# Patient Record
Sex: Female | Born: 1950 | Race: Black or African American | Hispanic: No | Marital: Single | Smoking: Former smoker
Health system: Southern US, Community
[De-identification: ages and names within clinical notes are randomized; demographics above are authoritative.]

## PROBLEM LIST (undated history)

## (undated) DIAGNOSIS — I10 Essential (primary) hypertension: Secondary | ICD-10-CM

## (undated) DIAGNOSIS — R51 Headache: Secondary | ICD-10-CM

## (undated) DIAGNOSIS — R011 Cardiac murmur, unspecified: Secondary | ICD-10-CM

## (undated) DIAGNOSIS — M1990 Unspecified osteoarthritis, unspecified site: Secondary | ICD-10-CM

## (undated) DIAGNOSIS — Z889 Allergy status to unspecified drugs, medicaments and biological substances status: Secondary | ICD-10-CM

## (undated) DIAGNOSIS — R519 Headache, unspecified: Secondary | ICD-10-CM

## (undated) DIAGNOSIS — R609 Edema, unspecified: Secondary | ICD-10-CM

## (undated) HISTORY — PX: DIAGNOSTIC LAPAROSCOPY: SUR761

## (undated) HISTORY — PX: TONSILLECTOMY: SUR1361

## (undated) HISTORY — PX: OTHER SURGICAL HISTORY: SHX169

---

## 1985-07-18 HISTORY — PX: ABDOMINAL HYSTERECTOMY: SHX81

## 1985-07-18 HISTORY — PX: APPENDECTOMY: SHX54

## 1991-07-19 HISTORY — PX: KNEE ARTHROSCOPY: SUR90

## 1993-07-18 HISTORY — PX: CHOLECYSTECTOMY: SHX55

## 2004-10-25 ENCOUNTER — Ambulatory Visit: Payer: Self-pay | Admitting: Family Medicine

## 2006-01-06 ENCOUNTER — Ambulatory Visit: Payer: Self-pay | Admitting: Family Medicine

## 2006-10-18 ENCOUNTER — Ambulatory Visit: Payer: Self-pay | Admitting: Unknown Physician Specialty

## 2007-01-18 ENCOUNTER — Ambulatory Visit: Payer: Self-pay | Admitting: Family Medicine

## 2008-02-05 ENCOUNTER — Ambulatory Visit: Payer: Self-pay | Admitting: Family Medicine

## 2009-04-07 ENCOUNTER — Ambulatory Visit: Payer: Self-pay | Admitting: Family Medicine

## 2010-05-12 ENCOUNTER — Ambulatory Visit: Payer: Self-pay

## 2011-12-27 ENCOUNTER — Ambulatory Visit: Payer: Self-pay

## 2013-01-16 ENCOUNTER — Ambulatory Visit: Payer: Self-pay

## 2014-01-29 ENCOUNTER — Ambulatory Visit: Payer: Self-pay

## 2014-05-16 ENCOUNTER — Encounter (HOSPITAL_COMMUNITY): Payer: Self-pay | Admitting: Pharmacy Technician

## 2014-05-19 ENCOUNTER — Inpatient Hospital Stay (HOSPITAL_COMMUNITY)
Admission: RE | Admit: 2014-05-19 | Discharge: 2014-05-19 | Disposition: A | Discharge status: Home / Self Care | Payer: Self-pay | Source: Ambulatory Visit

## 2014-05-19 NOTE — Pre-Procedure Instructions (Signed)
Sophia CurryRena T Lowery  05/19/2014   Your procedure is scheduled on:  Friday, Nov. 13th   [facility] at 5:30 AM.  Call this number if you have problems the morning of surgery: [phone]   Remember:   Do not eat food or drink liquids after midnight Thursday.   Take these medicines the morning of surgery with A SIP OF WATER: Zyrtec   Do not wear jewelry, make-up or nail polish.  Do not wear lotions, powders, or perfumes. You may NOT wear deodorant the day of surgery.  Do not shave underarms & legs 48 hours prior to surgery.  Do not bring valuables to the hospital.  [facility] is not responsible for any belongings or valuables.               Contacts, dentures or bridgework may not be worn into surgery.  Leave suitcase in the car. After surgery it may be brought to your room.  For patients admitted to the hospital, discharge time is determined by your treatment team.    Name and phone number of your driver:    Special Instructions: "Preparing for Surgery" instruction sheet.   Please read over the following fact sheets that you were given: Pain Booklet, Coughing and Deep Breathing, Blood Transfusion Information, MRSA Information and Surgical Site Infection Prevention

## 2014-05-20 NOTE — H&P (Signed)
  Sophia CurryRena T Stclair is an 63 y.o. female.    Chief Complaint: right knee pain   HPI: Pt is a 63 y.o. female complaining of right knee pain for multiple years. Pain had continually increased since the beginning. X-rays in the clinic show end-stage arthritic changes of the right knee. Pt has tried various conservative treatments which have failed to alleviate their symptoms, including injections and therapy. Various options are discussed with the patient. Risks, benefits and expectations were discussed with the patient. Patient understand the risks, benefits and expectations and wishes to proceed with surgery.   PCP:  Aniceto BossSETTLE,PAUL C, MD  D/C Plans:  Home with HHPT  PMH: No past medical history on file.  PSH: No past surgical history on file.  Social History:  has no tobacco, alcohol, and drug history on file.  Allergies:  Allergies  Allergen Reactions  . Other     Certain antibiotics cause yeast infections    Medications: No current facility-administered medications for this encounter.   Current Outpatient Prescriptions  Medication Sig Dispense Refill  . cetirizine (ZYRTEC) 10 MG tablet Take 10 mg by mouth daily.    . furosemide (LASIX) 20 MG tablet Take 20 mg by mouth daily.    Marland Kitchen. ibuprofen (ADVIL,MOTRIN) 200 MG tablet Take 800 mg by mouth every 6 (six) hours as needed for mild pain or moderate pain.    . Multiple Vitamins-Minerals (MULTIVITAMIN ADULTS 50+ PO) Take 1 tablet by mouth daily.    . potassium chloride SA (K-DUR,KLOR-CON) 20 MEQ tablet Take 20 mEq by mouth daily.      No results found for this or any previous visit (from the past 48 hour(s)). No results found.  ROS: Pain with rom of the right lower extremity  Physical Exam:  Alert and oriented 63 y.o. female in no acute distress Cranial nerves 2-12 intact Cervical spine: full rom with no tenderness, nv intact distally Chest: active breath sounds bilaterally, no wheeze rhonchi or rales Heart: regular rate and  rhythm, no murmur Abd: non tender non distended with active bowel sounds Hip is stable with rom  Right knee with moderate crepitus and moderate medial joint line tenderness nv intact distally Slight antalgic gait No rashes   Assessment/Plan Assessment: right knee end stage osteoarthritis  Plan: Patient will undergo a right total knee arthroplasty by [facility]Nor[facility]. Risks benefits and expectations were discussed with the patient. Patient understand risks, benefits and expectations and wishes to proceed.

## 2014-05-26 ENCOUNTER — Encounter (HOSPITAL_COMMUNITY): Payer: Self-pay

## 2014-05-26 ENCOUNTER — Encounter (HOSPITAL_COMMUNITY)
Admission: RE | Admit: 2014-05-26 | Discharge: 2014-05-26 | Disposition: A | Discharge status: Home / Self Care | Payer: No Typology Code available for payment source | Source: Ambulatory Visit | Attending: Orthopedic Surgery | Admitting: Orthopedic Surgery

## 2014-05-26 DIAGNOSIS — M179 Osteoarthritis of knee, unspecified: Secondary | ICD-10-CM | POA: Diagnosis not present

## 2014-05-26 DIAGNOSIS — Z01812 Encounter for preprocedural laboratory examination: Secondary | ICD-10-CM | POA: Diagnosis not present

## 2014-05-26 HISTORY — DX: Headache, unspecified: R51.9

## 2014-05-26 HISTORY — DX: Essential (primary) hypertension: I10

## 2014-05-26 HISTORY — DX: Cardiac murmur, unspecified: R01.1

## 2014-05-26 HISTORY — DX: Headache: R51

## 2014-05-26 HISTORY — DX: Allergy status to unspecified drugs, medicaments and biological substances: Z88.9

## 2014-05-26 HISTORY — DX: Unspecified osteoarthritis, unspecified site: M19.90

## 2014-05-26 HISTORY — DX: Edema, unspecified: R60.9

## 2014-05-26 LAB — TYPE AND SCREEN
ABO/RH(D): O POS
Antibody Screen: NEGATIVE

## 2014-05-26 LAB — BASIC METABOLIC PANEL
ANION GAP: 13 (ref 5–15)
BUN: 18 mg/dL (ref 6–23)
CALCIUM: 9.2 mg/dL (ref 8.4–10.5)
CHLORIDE: 101 meq/L (ref 96–112)
CO2: 27 meq/L (ref 19–32)
Creatinine, Ser: 0.77 mg/dL (ref 0.50–1.10)
GFR calc Af Amer: >90 mL/min (ref >90)
GFR calc non Af Amer: 89 mL/min — ABNORMAL LOW (ref >90)
GLUCOSE: 89 mg/dL (ref 70–99)
Potassium: 4.1 mEq/L (ref 3.7–5.3)
Sodium: 141 mEq/L (ref 137–147)

## 2014-05-26 LAB — CBC
HEMATOCRIT: 39.9 % (ref 36.0–46.0)
Hemoglobin: 12.8 g/dL (ref 12.0–15.0)
MCH: 27.8 pg (ref 26.0–34.0)
MCHC: 32.1 g/dL (ref 30.0–36.0)
MCV: 86.6 fL (ref 78.0–100.0)
PLATELETS: 339 10*3/uL (ref 150–400)
RBC: 4.61 MIL/uL (ref 3.87–5.11)
RDW: 13.8 % (ref 11.5–15.5)
WBC: 9.2 10*3/uL (ref 4.0–10.5)

## 2014-05-26 LAB — SURGICAL PCR SCREEN
MRSA, PCR: NEGATIVE
Staphylococcus aureus: NEGATIVE

## 2014-05-26 MED ORDER — CHLORHEXIDINE GLUCONATE 4 % EX LIQD: 60.0000 mL | Freq: Once | CUTANEOUS | Status: DC

## 2014-05-26 NOTE — Pre-Procedure Instructions (Signed)
Sophia CurryRena T Lowery  05/26/2014   Your procedure is scheduled on:  Friday, Nov. 13th   [facility] at 5:30 AM.  Call this number if you have problems the morning of surgery: [phone]   Remember:   Do not eat food or drink liquids after midnight Thursday.   Take these medicines the morning of surgery with A SIP OF WATER: Zyrtec   Do not wear jewelry, make-up or nail polish.  Do not wear lotions, powders, or perfumes. You may NOT wear deodorant the day of surgery.  Do not shave underarms & legs 48 hours prior to surgery.  Do not bring valuables to the hospital.  [facility] is not responsible for any belongings or valuables.               Contacts, dentures or bridgework may not be worn into surgery.  Leave suitcase in the car. After surgery it may be brought to your room.  For patients admitted to the hospital, discharge time is determined by your treatment team.       Special Instructions: [facility] - Preparing for Surgery  Before surgery, you can play an important role.  Because skin is not sterile, your skin needs to be as free of germs as possible.  You can reduce the number of germs on you skin by washing with CHG (chlorahexidine gluconate) soap before surgery.  CHG is an antiseptic cleaner which kills germs and bonds with the skin to continue killing germs even after washing.  Please DO NOT use if you have an allergy to CHG or antibacterial soaps.  If your skin becomes reddened/irritated stop using the CHG and inform your nurse when you arrive at Short Stay.  Do not shave (including legs and underarms) for at least 48 hours prior to the first CHG shower.  You may shave your face.  Please follow these instructions carefully:   1.  Shower with CHG Soap the night before surgery and the                                morning of Surgery.  2.  If you choose to wash your hair, wash your hair first as usual with your       normal shampoo.  3.  After you  shampoo, rinse your hair and body thoroughly to remove the                      Shampoo.  4.  Use CHG as you would any other liquid soap.  You can apply chg directly       to the skin and wash gently with scrungie or a clean washcloth.  5.  Apply the CHG Soap to your body ONLY FROM THE NECK DOWN.        Do not use on open wounds or open sores.  Avoid contact with your eyes,       ears, mouth and genitals (private parts).  Wash genitals (private parts)       with your normal soap.  6.  Wash thoroughly, paying special attention to the area where your surgery        will be performed.  7.  Thoroughly rinse your body with warm water from the neck down.  8.  DO NOT shower/wash with your normal soap after using and rinsing off  the CHG Soap.  9.  Pat yourself dry with a clean towel.            10.  Wear clean pajamas.            11.  Place clean sheets on your bed the night of your first shower and do not        sleep with pets.  Day of Surgery  Do not apply any lotions/deoderants the morning of surgery.  Please wear clean clothes to the hospital/surgery center.      Please read over the following fact sheets that you were given: Pain Booklet, Coughing and Deep Breathing, Blood Transfusion Information, MRSA Information and Surgical Site Infection Prevention

## 2014-05-27 LAB — ABO/RH: ABO/RH(D): O POS

## 2014-05-27 NOTE — Progress Notes (Signed)
Anesthesia Chart Review:  Patient is a 63 year old female scheduled for right TKA on 05/30/14 by Dr. Ranell PatrickNorris. Posted for [facility].  History includes former smoker, HTN, murmur (childhood), arthritis, edema (on Lasix), tonsillectomy, hysterectomy, cholecystectomy. BMI is consistent with morbid obesity. PCP is Dr. Salvatore DecentPaul Settle in [place] who "approved" her for knee replacement following a 04/08/14 office visit.  EKG on 04/08/14 (PCP) showed: NSR.  CXR was requested from Dr. Eudelia BunchSettle's office, but is still pending.  Preoperative labs noted.  PT/PTT not done at PAT, so ordered for the day of surgery since this is a TKA.  Sophia Ochsllison Crystalle Popwell, PA-C [facility] Short Stay Center/Anesthesiology Phone [phone] 05/27/2014 11:16 AM

## 2014-05-29 MED ORDER — CEFAZOLIN SODIUM-DEXTROSE 2-3 GM-% IV SOLR
2.0000 g | INTRAVENOUS | Status: AC
  Administered 2014-05-30: 2 g via INTRAVENOUS
  Filled 2014-05-29: qty 50

## 2014-05-30 ENCOUNTER — Ambulatory Visit (HOSPITAL_COMMUNITY): Payer: No Typology Code available for payment source | Admitting: Vascular Surgery

## 2014-05-30 ENCOUNTER — Ambulatory Visit (HOSPITAL_COMMUNITY): Payer: No Typology Code available for payment source | Admitting: Anesthesiology

## 2014-05-30 ENCOUNTER — Encounter (HOSPITAL_COMMUNITY): Payer: Self-pay | Admitting: *Deleted

## 2014-05-30 ENCOUNTER — Inpatient Hospital Stay (HOSPITAL_COMMUNITY): Payer: No Typology Code available for payment source

## 2014-05-30 ENCOUNTER — Encounter (HOSPITAL_COMMUNITY)
Admission: RE | Disposition: A | Discharge status: Skilled Nursing Facility | Payer: Self-pay | Source: Ambulatory Visit | Attending: Orthopedic Surgery

## 2014-05-30 ENCOUNTER — Inpatient Hospital Stay (HOSPITAL_COMMUNITY)
Admission: RE | Admit: 2014-05-30 | Discharge: 2014-06-03 | DRG: 470 | Disposition: A | Discharge status: Skilled Nursing Facility | Payer: No Typology Code available for payment source | Source: Ambulatory Visit | Attending: Orthopedic Surgery | Admitting: Orthopedic Surgery

## 2014-05-30 DIAGNOSIS — I10 Essential (primary) hypertension: Secondary | ICD-10-CM | POA: Diagnosis present

## 2014-05-30 DIAGNOSIS — M1711 Unilateral primary osteoarthritis, right knee: Secondary | ICD-10-CM | POA: Diagnosis present

## 2014-05-30 DIAGNOSIS — Z6841 Body Mass Index (BMI) 40.0 and over, adult: Secondary | ICD-10-CM | POA: Diagnosis not present

## 2014-05-30 DIAGNOSIS — M179 Osteoarthritis of knee, unspecified: Principal | ICD-10-CM | POA: Diagnosis present

## 2014-05-30 DIAGNOSIS — M25561 Pain in right knee: Secondary | ICD-10-CM | POA: Diagnosis present

## 2014-05-30 HISTORY — PX: TOTAL KNEE ARTHROPLASTY: SHX125

## 2014-05-30 LAB — CREATININE, SERUM
Creatinine, Ser: 0.66 mg/dL (ref 0.50–1.10)
GFR calc Af Amer: >90 mL/min (ref >90)
GFR calc non Af Amer: >90 mL/min (ref >90)

## 2014-05-30 LAB — PROTIME-INR
INR: 1.02 (ref 0.00–1.49)
Prothrombin Time: 13.5 seconds (ref 11.6–15.2)

## 2014-05-30 LAB — CBC
HEMATOCRIT: 39.5 % (ref 36.0–46.0)
Hemoglobin: 12.5 g/dL (ref 12.0–15.0)
MCH: 27 pg (ref 26.0–34.0)
MCHC: 31.6 g/dL (ref 30.0–36.0)
MCV: 85.3 fL (ref 78.0–100.0)
Platelets: 251 10*3/uL (ref 150–400)
RBC: 4.63 MIL/uL (ref 3.87–5.11)
RDW: 13.9 % (ref 11.5–15.5)
WBC: 14.2 10*3/uL — ABNORMAL HIGH (ref 4.0–10.5)

## 2014-05-30 LAB — APTT: aPTT: 31 seconds (ref 24–37)

## 2014-05-30 SURGERY — ARTHROPLASTY, KNEE, TOTAL
Anesthesia: Regional | Site: Knee | Laterality: Right

## 2014-05-30 MED ORDER — PROMETHAZINE HCL 25 MG/ML IJ SOLN: 6.2500 mg | INTRAMUSCULAR | Status: DC | PRN

## 2014-05-30 MED ORDER — LORATADINE 10 MG PO TABS
10.0000 mg | ORAL_TABLET | Freq: Every day | ORAL | Status: DC
Start: 2014-05-31 — End: 2014-06-03
  Administered 2014-05-31 – 2014-06-03 (×4): 10 mg via ORAL
  Filled 2014-05-30 (×4): qty 1

## 2014-05-30 MED ORDER — PHENOL 1.4 % MT LIQD: 1.0000 | OROMUCOSAL | Status: DC | PRN

## 2014-05-30 MED ORDER — FENTANYL CITRATE 0.05 MG/ML IJ SOLN
INTRAMUSCULAR | Status: AC
  Filled 2014-05-30: qty 5

## 2014-05-30 MED ORDER — WARFARIN VIDEO
Freq: Once | Status: AC
  Administered 2014-05-31: 18:00:00

## 2014-05-30 MED ORDER — ROCURONIUM BROMIDE 50 MG/5ML IV SOLN
INTRAVENOUS | Status: AC
  Filled 2014-05-30: qty 1

## 2014-05-30 MED ORDER — LACTATED RINGERS IV SOLN
INTRAVENOUS | Status: DC | PRN
  Administered 2014-05-30 (×2): via INTRAVENOUS

## 2014-05-30 MED ORDER — NEOSTIGMINE METHYLSULFATE 10 MG/10ML IV SOLN
INTRAVENOUS | Status: AC
  Filled 2014-05-30: qty 1

## 2014-05-30 MED ORDER — OXYCODONE-ACETAMINOPHEN 5-325 MG PO TABS: 1.0000 | ORAL_TABLET | ORAL | Status: DC | PRN

## 2014-05-30 MED ORDER — OXYCODONE HCL 5 MG/5ML PO SOLN: 5.0000 mg | Freq: Once | ORAL | Status: DC | PRN

## 2014-05-30 MED ORDER — GLYCOPYRROLATE 0.2 MG/ML IJ SOLN
INTRAMUSCULAR | Status: AC
  Filled 2014-05-30: qty 2

## 2014-05-30 MED ORDER — METOCLOPRAMIDE HCL 5 MG/ML IJ SOLN: 5.0000 mg | Freq: Three times a day (TID) | INTRAMUSCULAR | Status: DC | PRN

## 2014-05-30 MED ORDER — EPHEDRINE SULFATE 50 MG/ML IJ SOLN
INTRAMUSCULAR | Status: AC
  Filled 2014-05-30: qty 1

## 2014-05-30 MED ORDER — WARFARIN SODIUM 5 MG PO TABS: 5.0000 mg | ORAL_TABLET | Freq: Every day | ORAL | Status: DC

## 2014-05-30 MED ORDER — FENTANYL CITRATE 0.05 MG/ML IJ SOLN
INTRAMUSCULAR | Status: DC | PRN
  Administered 2014-05-30 (×5): 50 ug via INTRAVENOUS

## 2014-05-30 MED ORDER — METHOCARBAMOL 500 MG PO TABS
500.0000 mg | ORAL_TABLET | Freq: Four times a day (QID) | ORAL | Status: DC | PRN
Start: 2014-05-30 — End: 2014-06-03
  Administered 2014-05-30 – 2014-06-03 (×10): 500 mg via ORAL
  Filled 2014-05-30 (×11): qty 1

## 2014-05-30 MED ORDER — WARFARIN SODIUM 10 MG PO TABS
10.0000 mg | ORAL_TABLET | Freq: Once | ORAL | Status: AC
  Administered 2014-05-30: 10 mg via ORAL
  Filled 2014-05-30: qty 1

## 2014-05-30 MED ORDER — SODIUM CHLORIDE 0.9 % IV SOLN
INTRAVENOUS | Status: DC
  Administered 2014-05-30: 14:00:00 via INTRAVENOUS

## 2014-05-30 MED ORDER — NEOSTIGMINE METHYLSULFATE 10 MG/10ML IV SOLN
INTRAVENOUS | Status: DC | PRN
Start: 2014-05-30 — End: 2014-05-30
  Administered 2014-05-30: 3 mg via INTRAVENOUS

## 2014-05-30 MED ORDER — ROCURONIUM BROMIDE 100 MG/10ML IV SOLN
INTRAVENOUS | Status: DC | PRN
  Administered 2014-05-30: 50 mg via INTRAVENOUS

## 2014-05-30 MED ORDER — ACETAMINOPHEN 325 MG PO TABS
650.0000 mg | ORAL_TABLET | Freq: Four times a day (QID) | ORAL | Status: DC | PRN
  Administered 2014-06-02 (×3): 650 mg via ORAL
  Filled 2014-05-30 (×3): qty 2

## 2014-05-30 MED ORDER — SODIUM CHLORIDE 0.9 % IJ SOLN
INTRAMUSCULAR | Status: AC
  Filled 2014-05-30: qty 10

## 2014-05-30 MED ORDER — MULTIVITAMIN ADULTS 50+ PO TABS: 1.0000 | ORAL_TABLET | Freq: Every day | ORAL | Status: DC

## 2014-05-30 MED ORDER — ONDANSETRON HCL 4 MG/2ML IJ SOLN
INTRAMUSCULAR | Status: AC
  Filled 2014-05-30: qty 2

## 2014-05-30 MED ORDER — MIDAZOLAM HCL 2 MG/2ML IJ SOLN
INTRAMUSCULAR | Status: AC
  Filled 2014-05-30: qty 2

## 2014-05-30 MED ORDER — ADULT MULTIVITAMIN W/MINERALS CH
1.0000 | ORAL_TABLET | Freq: Every day | ORAL | Status: DC
  Administered 2014-05-31 – 2014-06-03 (×3): 1 via ORAL
  Filled 2014-05-30 (×5): qty 1

## 2014-05-30 MED ORDER — CELECOXIB 200 MG PO CAPS
200.0000 mg | ORAL_CAPSULE | Freq: Two times a day (BID) | ORAL | Status: DC
  Administered 2014-05-30 – 2014-06-03 (×8): 200 mg via ORAL
  Filled 2014-05-30 (×10): qty 1

## 2014-05-30 MED ORDER — SODIUM CHLORIDE 0.9 % IR SOLN
Status: DC | PRN
  Administered 2014-05-30 (×2): 1000 mL

## 2014-05-30 MED ORDER — HYDROMORPHONE HCL 1 MG/ML IJ SOLN
INTRAMUSCULAR | Status: AC
  Filled 2014-05-30: qty 1

## 2014-05-30 MED ORDER — ACETAMINOPHEN 650 MG RE SUPP: 650.0000 mg | Freq: Four times a day (QID) | RECTAL | Status: DC | PRN

## 2014-05-30 MED ORDER — BISACODYL 10 MG RE SUPP
10.0000 mg | Freq: Every day | RECTAL | Status: DC | PRN
  Administered 2014-06-03: 10 mg via RECTAL
  Filled 2014-05-30: qty 1

## 2014-05-30 MED ORDER — METHOCARBAMOL 1000 MG/10ML IJ SOLN
500.0000 mg | Freq: Four times a day (QID) | INTRAVENOUS | Status: DC | PRN
  Filled 2014-05-30: qty 5

## 2014-05-30 MED ORDER — GLYCOPYRROLATE 0.2 MG/ML IJ SOLN
INTRAMUSCULAR | Status: DC | PRN
  Administered 2014-05-30: 0.4 mg via INTRAVENOUS

## 2014-05-30 MED ORDER — OXYCODONE HCL 5 MG PO TABS: 5.0000 mg | ORAL_TABLET | Freq: Once | ORAL | Status: DC | PRN

## 2014-05-30 MED ORDER — METHOCARBAMOL 1000 MG/10ML IJ SOLN
500.0000 mg | INTRAVENOUS | Status: AC
  Administered 2014-05-30: 500 mg via INTRAVENOUS
  Filled 2014-05-30: qty 5

## 2014-05-30 MED ORDER — HYDROMORPHONE HCL 1 MG/ML IJ SOLN
0.2500 mg | INTRAMUSCULAR | Status: DC | PRN
  Administered 2014-05-30 (×4): 0.5 mg via INTRAVENOUS

## 2014-05-30 MED ORDER — ENOXAPARIN SODIUM 30 MG/0.3ML ~~LOC~~ SOLN
30.0000 mg | Freq: Two times a day (BID) | SUBCUTANEOUS | Status: DC
  Administered 2014-05-31 – 2014-06-03 (×7): 30 mg via SUBCUTANEOUS
  Filled 2014-05-30 (×9): qty 0.3

## 2014-05-30 MED ORDER — METOCLOPRAMIDE HCL 10 MG PO TABS: 5.0000 mg | ORAL_TABLET | Freq: Three times a day (TID) | ORAL | Status: DC | PRN

## 2014-05-30 MED ORDER — CEFAZOLIN SODIUM-DEXTROSE 2-3 GM-% IV SOLR
2.0000 g | Freq: Four times a day (QID) | INTRAVENOUS | Status: AC
  Administered 2014-05-30 – 2014-05-31 (×2): 2 g via INTRAVENOUS
  Filled 2014-05-30 (×2): qty 50

## 2014-05-30 MED ORDER — MIDAZOLAM HCL 5 MG/5ML IJ SOLN
INTRAMUSCULAR | Status: DC | PRN
  Administered 2014-05-30: 2 mg via INTRAVENOUS

## 2014-05-30 MED ORDER — POLYETHYLENE GLYCOL 3350 17 G PO PACK
17.0000 g | PACK | Freq: Every day | ORAL | Status: DC | PRN
  Administered 2014-06-01 – 2014-06-02 (×2): 17 g via ORAL
  Filled 2014-05-30 (×2): qty 1

## 2014-05-30 MED ORDER — METHOCARBAMOL 500 MG PO TABS: 500.0000 mg | ORAL_TABLET | Freq: Three times a day (TID) | ORAL | Status: AC | PRN

## 2014-05-30 MED ORDER — ONDANSETRON HCL 4 MG PO TABS: 4.0000 mg | ORAL_TABLET | Freq: Four times a day (QID) | ORAL | Status: DC | PRN

## 2014-05-30 MED ORDER — PATIENT'S GUIDE TO USING COUMADIN BOOK
Freq: Once | Status: AC
  Administered 2014-05-30: 1
  Filled 2014-05-30: qty 1

## 2014-05-30 MED ORDER — PROPOFOL 10 MG/ML IV BOLUS
INTRAVENOUS | Status: DC | PRN
  Administered 2014-05-30: 150 mg via INTRAVENOUS

## 2014-05-30 MED ORDER — ONDANSETRON HCL 4 MG/2ML IJ SOLN
INTRAMUSCULAR | Status: DC | PRN
  Administered 2014-05-30: 4 mg via INTRAVENOUS

## 2014-05-30 MED ORDER — WARFARIN - PHARMACIST DOSING INPATIENT
Freq: Every day | Status: DC
  Administered 2014-06-01: 18:00:00
  Filled 2014-05-30: qty 1

## 2014-05-30 MED ORDER — LIDOCAINE HCL (CARDIAC) 20 MG/ML IV SOLN
INTRAVENOUS | Status: DC | PRN
  Administered 2014-05-30: 80 mg via INTRAVENOUS

## 2014-05-30 MED ORDER — FUROSEMIDE 20 MG PO TABS
20.0000 mg | ORAL_TABLET | Freq: Every day | ORAL | Status: DC
  Administered 2014-05-31 – 2014-06-03 (×4): 20 mg via ORAL
  Filled 2014-05-30 (×5): qty 1

## 2014-05-30 MED ORDER — PROPOFOL 10 MG/ML IV BOLUS
INTRAVENOUS | Status: AC
  Filled 2014-05-30: qty 20

## 2014-05-30 MED ORDER — HYDROMORPHONE HCL 1 MG/ML IJ SOLN
1.0000 mg | INTRAMUSCULAR | Status: DC | PRN
  Administered 2014-05-30 – 2014-05-31 (×5): 1 mg via INTRAVENOUS
  Administered 2014-05-31: 2 mg via INTRAVENOUS
  Administered 2014-05-31: 1 mg via INTRAVENOUS
  Administered 2014-05-31: 2 mg via INTRAVENOUS
  Administered 2014-06-01: 1 mg via INTRAVENOUS
  Administered 2014-06-01: 2 mg via INTRAVENOUS
  Administered 2014-06-01: 1 mg via INTRAVENOUS
  Filled 2014-05-30: qty 2
  Filled 2014-05-30 (×4): qty 1
  Filled 2014-05-30: qty 2
  Filled 2014-05-30: qty 1
  Filled 2014-05-30: qty 2
  Filled 2014-05-30 (×2): qty 1
  Filled 2014-05-30: qty 2
  Filled 2014-05-30 (×3): qty 1

## 2014-05-30 MED ORDER — ONDANSETRON HCL 4 MG/2ML IJ SOLN: 4.0000 mg | Freq: Four times a day (QID) | INTRAMUSCULAR | Status: DC | PRN

## 2014-05-30 MED ORDER — 0.9 % SODIUM CHLORIDE (POUR BTL) OPTIME
TOPICAL | Status: DC | PRN
  Administered 2014-05-30: 1000 mL

## 2014-05-30 MED ORDER — POTASSIUM CHLORIDE CRYS ER 20 MEQ PO TBCR
20.0000 meq | EXTENDED_RELEASE_TABLET | Freq: Every day | ORAL | Status: DC
  Administered 2014-05-31 – 2014-06-03 (×4): 20 meq via ORAL
  Filled 2014-05-30 (×4): qty 1

## 2014-05-30 MED ORDER — OXYCODONE HCL 5 MG PO TABS
5.0000 mg | ORAL_TABLET | ORAL | Status: DC | PRN
  Administered 2014-05-30 – 2014-06-01 (×8): 10 mg via ORAL
  Administered 2014-06-01: 5 mg via ORAL
  Administered 2014-06-01 – 2014-06-03 (×10): 10 mg via ORAL
  Filled 2014-05-30 (×19): qty 2

## 2014-05-30 MED ORDER — BUPIVACAINE-EPINEPHRINE (PF) 0.5% -1:200000 IJ SOLN
INTRAMUSCULAR | Status: DC | PRN
  Administered 2014-05-30: 30 mL via PERINEURAL

## 2014-05-30 MED ORDER — MENTHOL 3 MG MT LOZG: 1.0000 | LOZENGE | OROMUCOSAL | Status: DC | PRN

## 2014-05-30 MED ORDER — LIDOCAINE HCL (CARDIAC) 20 MG/ML IV SOLN
INTRAVENOUS | Status: AC
  Filled 2014-05-30: qty 5

## 2014-05-30 SURGICAL SUPPLY — 60 items
BANDAGE ELASTIC 6 VELCRO ST LF (GAUZE/BANDAGES/DRESSINGS) ×2 IMPLANT
BANDAGE ESMARK 6X9 LF (GAUZE/BANDAGES/DRESSINGS) ×1 IMPLANT
BLADE SAG 18X100X1.27 (BLADE) ×2 IMPLANT
BLADE SAW SGTL 13.0X1.19X90.0M (BLADE) ×2 IMPLANT
BNDG ELASTIC 6X10 VLCR STRL LF (GAUZE/BANDAGES/DRESSINGS) ×2 IMPLANT
BNDG ESMARK 6X9 LF (GAUZE/BANDAGES/DRESSINGS) ×2
BNDG GAUZE ELAST 4 BULKY (GAUZE/BANDAGES/DRESSINGS) ×2 IMPLANT
BOWL SMART MIX CTS (DISPOSABLE) ×2 IMPLANT
CAP UPCHARGE REVISION TRAY ×2 IMPLANT
CAPT RP KNEE ×2 IMPLANT
CEMENT HV SMART SET (Cement) ×4 IMPLANT
COVER SURGICAL LIGHT HANDLE (MISCELLANEOUS) ×2 IMPLANT
CUFF TOURNIQUET SINGLE 34IN LL (TOURNIQUET CUFF) IMPLANT
CUFF TOURNIQUET SINGLE 44IN (TOURNIQUET CUFF) IMPLANT
DRAPE EXTREMITY T 121X128X90 (DRAPE) ×2 IMPLANT
DRAPE IMP U-DRAPE 54X76 (DRAPES) ×2 IMPLANT
DRAPE PROXIMA HALF (DRAPES) ×2 IMPLANT
DRAPE U-SHAPE 47X51 STRL (DRAPES) ×2 IMPLANT
DRSG ADAPTIC 3X8 NADH LF (GAUZE/BANDAGES/DRESSINGS) ×2 IMPLANT
DRSG PAD ABDOMINAL 8X10 ST (GAUZE/BANDAGES/DRESSINGS) ×2 IMPLANT
DURAPREP 26ML APPLICATOR (WOUND CARE) ×2 IMPLANT
ELECT CAUTERY BLADE 6.4 (BLADE) ×2 IMPLANT
ELECT REM PT RETURN 9FT ADLT (ELECTROSURGICAL) ×2
ELECTRODE REM PT RTRN 9FT ADLT (ELECTROSURGICAL) ×1 IMPLANT
GAUZE SPONGE 4X4 12PLY STRL (GAUZE/BANDAGES/DRESSINGS) ×2 IMPLANT
GLOVE BIOGEL PI ORTHO PRO 7.5 (GLOVE) ×1
GLOVE BIOGEL PI ORTHO PRO SZ8 (GLOVE) ×1
GLOVE ORTHO TXT STRL SZ7.5 (GLOVE) ×2 IMPLANT
GLOVE PI ORTHO PRO STRL 7.5 (GLOVE) ×1 IMPLANT
GLOVE PI ORTHO PRO STRL SZ8 (GLOVE) ×1 IMPLANT
GLOVE SURG ORTHO 8.5 STRL (GLOVE) ×2 IMPLANT
GOWN STRL REUS W/ TWL XL LVL3 (GOWN DISPOSABLE) ×3 IMPLANT
GOWN STRL REUS W/TWL XL LVL3 (GOWN DISPOSABLE) ×3
HANDPIECE INTERPULSE COAX TIP (DISPOSABLE) ×1
IMMOBILIZER KNEE 22 (SOFTGOODS) ×2 IMPLANT
IMMOBILIZER KNEE 22 UNIV (SOFTGOODS) IMPLANT
KIT BASIN OR (CUSTOM PROCEDURE TRAY) ×2 IMPLANT
KIT MANIFOLD (MISCELLANEOUS) ×2 IMPLANT
KIT ROOM TURNOVER OR (KITS) ×2 IMPLANT
MANIFOLD NEPTUNE II (INSTRUMENTS) ×2 IMPLANT
NS IRRIG 1000ML POUR BTL (IV SOLUTION) ×2 IMPLANT
PACK TOTAL JOINT (CUSTOM PROCEDURE TRAY) ×2 IMPLANT
PACK UNIVERSAL I (CUSTOM PROCEDURE TRAY) ×2 IMPLANT
PAD ARMBOARD 7.5X6 YLW CONV (MISCELLANEOUS) ×4 IMPLANT
SET HNDPC FAN SPRY TIP SCT (DISPOSABLE) ×1 IMPLANT
SPONGE GAUZE 4X4 12PLY STER LF (GAUZE/BANDAGES/DRESSINGS) ×2 IMPLANT
STAPLER VISISTAT 35W (STAPLE) ×2 IMPLANT
STRIP CLOSURE SKIN 1/2X4 (GAUZE/BANDAGES/DRESSINGS) ×2 IMPLANT
SUCTION FRAZIER TIP 10 FR DISP (SUCTIONS) ×2 IMPLANT
SUT MNCRL AB 3-0 PS2 18 (SUTURE) ×2 IMPLANT
SUT VIC AB 0 CT1 27 (SUTURE) ×2
SUT VIC AB 0 CT1 27XBRD ANBCTR (SUTURE) ×2 IMPLANT
SUT VIC AB 1 CT1 27 (SUTURE) ×3
SUT VIC AB 1 CT1 27XBRD ANBCTR (SUTURE) ×3 IMPLANT
SUT VIC AB 2-0 CT1 27 (SUTURE) ×2
SUT VIC AB 2-0 CT1 TAPERPNT 27 (SUTURE) ×2 IMPLANT
TOWEL OR 17X24 6PK STRL BLUE (TOWEL DISPOSABLE) ×2 IMPLANT
TOWEL OR 17X26 10 PK STRL BLUE (TOWEL DISPOSABLE) ×2 IMPLANT
TRAY FOLEY CATH 16FRSI W/METER (SET/KITS/TRAYS/PACK) IMPLANT
WATER STERILE IRR 1000ML POUR (IV SOLUTION) ×4 IMPLANT

## 2014-05-30 NOTE — Op Note (Signed)
Sophia Lowery:  Sophia Lowery                  ACCOUNT NO.:  [id number]  MEDICAL RECORD NO.:  [id number]  LOCATION:  [facility]                         FACILITY:  [facility]  PHYSICIAN:  Almedia BallsSteven R. Ranell PatrickNorris, M.D. DATE OF BIRTH:  Feb 12, 1951  DATE OF PROCEDURE:  05/30/2014 DATE OF DISCHARGE:                              OPERATIVE REPORT   PREOPERATIVE DIAGNOSIS:  Right knee end-stage osteoarthritis.  POSTOPERATIVE DIAGNOSIS:  Right knee end-stage osteoarthritis.  PROCEDURE PERFORMED:  Right total knee arthroplasty diffuse SIGMA rotating platform prosthesis.  ATTENDING SURGEON:  Malon KindleSteven Jens Siems, M.D.  ASSISTANT:  Konrad Felixhomas Brad San LuisDixon, [facility] who was scrubbed the entire procedure and necessary for satisfactory completion of surgery.  ANESTHESIA:  General anesthesia was used plus femoral block.  ESTIMATED BLOOD LOSS:  Minimal.  FLUID REPLACEMENT:  1500 mL crystalloid.  INSTRUMENT COUNT:  Correct.  COMPLICATIONS:  There were no complications.  ANTIBIOTICS:  Perioperative antibiotics were given.  INDICATIONS:  The patient is a 63 year old female presents with a history of worsening right knee pain.  The patient is now disabled by knee pain, unable to walk more than a block having to sit down and rest. The patient does used to assistance of cane to ambulate.  The patient's gait is unsteady.  The patient has bone-on-bone arthritis on x-ray. This failed conservative management consisting modification of activities, relative rest injections and presents for total knee arthroplasty to restore function, eliminate pain in her right knee. Informed consent obtained.  DESCRIPTION OF PROCEDURE:  After an adequate level of anesthesia achieved, the patient was positioned in the supine position.  The right leg correctly identified.  Nonsterile tourniquet placed on proximal thigh.  Right leg sterilely prepped and draped in usual manner.  Time- out was called.  We then after sterile prep and drape, we elevated the limb and  exsanguinated using Esmarch bandage.  We elevated the tourniquet to 350 mmHg.  Longitudinal midline incision was created the knee in flexion.  A medial parapatellar arthrotomy was created with a fresh 10 blade scalpel.  The patella was everted.  The lateral patellofemoral ligament divided in standard distal femur entered with a step-cut drill.  Inter medullary guide inserted.  We took 10 mm off the right distal femur set on 5 degrees for the right side.  We then went ahead and cut our tibia due to the extreme tightness in the knee.  We made a perpendicular cut with 2 degrees posterior slope for the MBT revision tray.  This was with an external alignment, jig and oscillating saw.  We then went ahead and removed ACL, PCL, and meniscal tissues and then sized the femur to a size 2.5 anterior down.  We placed our formalin block cutting anterior posterior and chamfer cuts.  We then used the lamina spreader removed posterior osteophytes off the posterior femoral condyles.  Next, also release remaining capsule.  We removed osteophytes off the proximal tibia.  We checked our flexion-extension gaps, which were symmetric at 10 mm.  We then went ahead and prepared the remaining preparation with a drill and punch on the proximal tibia. We placed her in the tree revision tray trial 2.5.  Next, we used our  box cutter guide to our box cut on the femur with an oscillating saw and then placed our 2.5 right femur in place, reduced it with a +10 per up with a 10 mm polyethylene insert 2.5 and then went ahead and resurfaced our patella going from 24 down to 16 mm thickness and then cut the patella for 16 mm thickness remaining and drilled for the 32 patellar button in place.  The patella button in place.  There was some lateral maltracking went ahead and did a despite all attempts lateralizing the femur externally rotating the tibia component on the tibia to internally rotate the tubercle.  There is still no  lateral tracking.  We went ahead and did a lateral retinacular release and that is all that is a no-touch technique.  No patellar dislocation.  At this point, we removed all trial components, thoroughly pulse irrigated the knee and cemented the components into place.  A 25 tibia 2, 5 right femur, we put a 12, 5 poly insert in place the knee in extension and then cemented the patella in place with a patellar clamp.  Once cement was hardened, we removed excess cement using quarter-inch curved osteotome and also a absent tonsil clamp.  Then, we did a final inspection, they felt like there was inability get a 15 insert this worked to get the knee out slightly and some hyperextension with the 12.5.  We selected the real 15 poly insert was placed on the tibial tray and then reduced the knee with nice snap. We had good stability in flexion and extension, definitely able to get full extension, but no hyperextension.  We thoroughly irrigated the knee, and then went ahead and repaired the medial parapatellar arthrotomy with interrupted #1 Vicryl suture followed by 0 and 2-0 Vicryl, subcutaneous closure and staples for skin.  Sterile compressive bandage was applied.  The patient tolerated surgery well.     Almedia BallsSteven R. Ranell PatrickNorris, M.D.     SRN/MEDQ  D:  05/30/2014  T:  05/30/2014  Job:  [id number]

## 2014-05-30 NOTE — Brief Op Note (Signed)
05/30/2014  9:54 AM  PATIENT:  Sophia Lowery  63 y.o. female  PRE-OPERATIVE DIAGNOSIS:  RIGHT KNEE OA , END STAGE POST-OPERATIVE DIAGNOSIS:  right knee OA, END STAGE  PROCEDURE:  Procedure(s): RIGHT TOTAL KNEE ARTHROPLASTY (Right) DEPUY SIGMA RP  SURGEON:  Surgeon(s) and Role:    * Verlee RossettiSteven R Johnnae Impastato, MD - Primary  PHYSICIAN ASSISTANT:   ASSISTANTS: Thea Gisthomas B Dixon, PA-C   ANESTHESIA:   regional and general  EBL:  Total I/O In: 1000 [I.V.:1000] Out: 150 [Urine:100; Blood:50]  BLOOD ADMINISTERED:none  DRAINS: none   LOCAL MEDICATIONS USED:  none   SPECIMEN:  No Specimen  DISPOSITION OF SPECIMEN:  N/A  COUNTS:  YES  TOURNIQUET:   Total Tourniquet Time Documented: Thigh (Right) - 98 minutes Total: Thigh (Right) - 98 minutes   DICTATION: .Other Dictation: Dictation Number [id number]  PLAN OF CARE: Admit to inpatient   PATIENT DISPOSITION:  PACU - hemodynamically stable.   Delay start of Pharmacological VTE agent (>24hrs) due to surgical blood loss or risk of bleeding: no

## 2014-05-30 NOTE — Evaluation (Signed)
Physical Therapy Evaluation Patient Details Name: Sophia Lowery MRN: [id number] DOB: [birth year] Today's Date: 05/30/2014   History of Present Illness  Patient is a 63 y/o female s/p R TKA. WBAT RLE. PMH of HTN, heart murmur and HA.  Clinical Impression  Patient presents with post surgical deficits of RLE secondary to above surgery. Pt with increased pain and lethargy limiting mobility assessment. Tolerated standing x2 bouts however not able to stay alert and awake to learn HEP as pt immediately fell asleep upon return to supine. Required cues throughout evaluation to breathe in through nose as continuous pulse ox beeping due to decreased Sa02. Pt would benefit from acute PT and St SNF to improve transfers, gait, balance and mobility so pt can maximize independence prior to return home. Pt agreeable to SNF - prefers [facility].    Follow Up Recommendations SNF;Supervision/Assistance - 24 hour    Equipment Recommendations  Other (comment) (defer to SNF)    Recommendations for Other Services OT consult     Precautions / Restrictions Precautions Precautions: Knee;Fall Precaution Booklet Issued: Yes (comment) Precaution Comments: Pt lethargic and not able to stay awake to instruct pt in HEP.  Required Braces or Orthoses: Knee Immobilizer - Right Knee Immobilizer - Right: On when out of bed or walking Restrictions Weight Bearing Restrictions: Yes RLE Weight Bearing: Weight bearing as tolerated      Mobility  Bed Mobility Overal bed mobility: Needs Assistance Bed Mobility: Supine to Sit;Sit to Supine     Supine to sit: Min assist Sit to supine: Min assist   General bed mobility comments: Min A to elevate trunk; VC's for hand placement/technique. Heavy use of rails. Min A to bring RLE into bed to return to supine. Pt able to scoot self up in bed with bed in trendelenberg position.  Transfers Overall transfer level: Needs assistance Equipment used: Rolling walker (2  wheeled) Transfers: Sit to/from Stand Sit to Stand: Min assist;From elevated surface         General transfer comment: Min A to rise and for balance as pt with increased unsteadiness in RLE upon standing.   Ambulation/Gait Ambulation/Gait assistance: Mod assist Ambulation Distance (Feet): 2 Feet Assistive device: Rolling walker (2 wheeled) Gait Pattern/deviations: Step-to pattern;Trunk flexed;Shuffle     General Gait Details: Pt able to take 2 steps along side bed to the right with Mod A for balance and increased WB through BUEs. Pt with partial knee buckling RLE during weight bearing.  Stairs            Wheelchair Mobility    Modified Rankin (Stroke Patients Only)       Balance Overall balance assessment: Needs assistance Sitting-balance support: Feet supported;No upper extremity supported Sitting balance-Leahy Scale: Fair Sitting balance - Comments: Able to sit EOB with BUEs support. Lethargic sitting EOB with eyes closed post standing bouts requiring need to return to supine.   Standing balance support: During functional activity Standing balance-Leahy Scale: Poor Standing balance comment: Requires BUE support on RW for safety/balance due to impaired sensation RLE and lethargy.                             Pertinent Vitals/Pain Pain Assessment: Faces Faces Pain Scale: Hurts even more Pain Location: right knee Pain Descriptors / Indicators: Sore;Aching Pain Intervention(s): Limited activity within patient's tolerance;Monitored during session;Repositioned;Ice applied    Home Living Family/patient expects to be discharged to:: Skilled nursing facility Living Arrangements: Alone  Prior Function           Comments: Pt using [facility] PTA.     Hand Dominance        Extremity/Trunk Assessment   Upper Extremity Assessment: Defer to OT evaluation           Lower Extremity Assessment: RLE deficits/detail;Generalized  weakness RLE Deficits / Details: Limited AROM hip flexion, knee flexion/extension, ankle AROM WFL.        Communication   Communication: No difficulties  Cognition Arousal/Alertness: Lethargic Behavior During Therapy: WFL for tasks assessed/performed Overall Cognitive Status: Within Functional Limits for tasks assessed                      General Comments General comments (skin integrity, edema, etc.): Attempted to instruct pt in LE exercises post mobility assessment however pt sleeping and not able to learn information.    Exercises Total Joint Exercises Ankle Circles/Pumps: Both;10 reps;Supine      Assessment/Plan    PT Assessment Patient needs continued PT services  PT Diagnosis Generalized weakness;Acute pain   PT Problem List Decreased strength;Pain;Decreased range of motion;Impaired sensation;Decreased activity tolerance;Decreased balance;Decreased mobility;Decreased knowledge of precautions  PT Treatment Interventions Balance training;Gait training;DME instruction;Patient/family education;Therapeutic activities;Therapeutic exercise;Functional mobility training;Neuromuscular re-education   PT Goals (Current goals can be found in the Care Plan section) Acute Rehab PT Goals Patient Stated Goal[facility] PT Goal Formulation: With patient Time For Goal Achievement: 06/13/14 Potential to Achieve Goals: Good    Frequency 7X/week   Barriers to discharge Decreased caregiver support      Co-evaluation               End of Session Equipment Utilized During Treatment: Gait belt;Oxygen Activity Tolerance: Patient limited by lethargy;Patient limited by pain Patient left: in bed;with call bell/phone within reach;with bed alarm set;with family/visitor present Nurse Communication: Mobility status;Precautions;Weight bearing status         Time: [id number] PT Time Calculation (min) (ACUTE ONLY): 19 min   Charges:   PT  Evaluation $Initial PT Evaluation Tier I: 1 Procedure PT Treatments $Therapeutic Activity: 8-22 mins   PT G CodesAlvie Heidelberg:          Folan, Perrie Ragin A 05/30/2014, 3:13 PM  Alvie HeidelbergShauna Folan, PT, DPT [phone]

## 2014-05-30 NOTE — Care Management Note (Signed)
CARE MANAGEMENT NOTE 05/30/2014  Patient:  Sophia Lowery,Sophia Lowery   Account Number:  [id number]  Date Initiated:  05/30/2014  Documentation initiated by:  Vance PeperBRADY,Agustin Swatek  Subjective/Objective Assessment:   63 yr old female admitted with osteoarthritis of the right knee. Patient had a right total knee arthroplasty.     Action/Plan:   Physical therapy has reccommend short term rehab for patient. Social worker to be notified. Case manager will follow.   Anticipated DC Date:  06/02/2014   Anticipated DC Plan:  SKILLED NURSING FACILITY  In-house referral  Clinical Social Worker      DC Planning Services  CM consult      [facility]  NA   Choice offered to / List presented to:     DME arranged  NA        HH arranged  NA      Status of service:  In process, will continue to follow Medicare Important Message given?   (If response is "NO", the following Medicare IM given date fields will be blank) Date Medicare IM given:   Medicare IM given by:   Date Additional Medicare IM given:   Additional Medicare IM given by:    Discharge Disposition:    Per UR Regulation:  Reviewed for med. necessity/level of care/duration of stay

## 2014-05-30 NOTE — Transfer of Care (Signed)
Immediate Anesthesia Transfer of Care Note  Patient: Sophia Lowery  Procedure(s) Performed: Procedure(s): RIGHT TOTAL KNEE ARTHROPLASTY (Right)  Patient Location: PACU  Anesthesia Type:General  Level of Consciousness: awake, alert  and oriented  Airway & Oxygen Therapy: Patient Spontanous Breathing and Patient connected to nasal cannula oxygen  Post-op Assessment: Report given to PACU RN, Post -op Vital signs reviewed and stable and Patient moving all extremities  Post vital signs: Reviewed and stable  Complications: No apparent anesthesia complications

## 2014-05-30 NOTE — Anesthesia Postprocedure Evaluation (Signed)
  Anesthesia Post-op Note  Patient: Sophia Lowery  Procedure(s) Performed: Procedure(s): RIGHT TOTAL KNEE ARTHROPLASTY (Right)  Patient Location: PACU  Anesthesia Type:GA combined with regional for post-op pain  Level of Consciousness: awake and alert   Airway and Oxygen Therapy: Patient Spontanous Breathing  Post-op Pain: moderate  Post-op Assessment: Post-op Vital signs reviewed  Post-op Vital Signs: stable  Last Vitals:  Filed Vitals:   05/30/14 1130  BP: 194/94  Pulse: 74  Temp: 36.3 C  Resp: 16    Complications: No apparent anesthesia complications

## 2014-05-30 NOTE — Interval H&P Note (Signed)
History and Physical Interval Note:  05/30/2014 7:28 AM  Gayland Curryena T Steinfeldt  has presented today for surgery, with the diagnosis of RIGHT KNEE OA   The various methods of treatment have been discussed with the patient and family. After consideration of risks, benefits and other options for treatment, the patient has consented to  Procedure(s): RIGHT TOTAL KNEE ARTHROPLASTY (Right) as a surgical intervention .  The patient's history has been reviewed, patient examined, no change in status, stable for surgery.  I have reviewed the patient's chart and labs.  Questions were answered to the patient's satisfaction.     Rudolph Daoust,STEVEN R

## 2014-05-30 NOTE — Progress Notes (Signed)
Orthopedic Tech Progress Note Patient Details:  Sophia Lowery 1950/08/30 [id number] CPM applied to RLE with appropriate settings. OHF applied to bed. Bone foam provided. CPM Right Knee CPM Right Knee: On Right Knee Flexion (Degrees): 90 Right Knee Extension (Degrees): 0   Asia R Thompson 05/30/2014, 11:10 AM

## 2014-05-30 NOTE — Progress Notes (Signed)
Utilization review completed.  

## 2014-05-30 NOTE — Anesthesia Procedure Notes (Addendum)
Anesthesia Regional Block:  Femoral nerve block  Pre-Anesthetic Checklist: ,, timeout performed, Correct Patient, Correct Site, Correct Laterality, Correct Procedure, Correct Position, site marked, Risks and benefits discussed, at surgeon's request and post-op pain management  Laterality: Right and Upper  Prep: chloraprep       Needles:  Injection technique: Single-shot  Needle Type: Echogenic Needle     Needle Length: 9cm 9 cm Needle Gauge: 22 and 22 G  Needle insertion depth: 9 cm   Additional Needles:  Procedures: ultrasound guided (picture in chart) and nerve stimulator Femoral nerve block  Nerve Stimulator or Paresthesia:  Response: Twitch elicited, 0.8 mA,   Additional Responses:   Narrative:  Start time: 05/30/2014 7:00 AM End time: 05/30/2014 7:15 AM Injection made incrementally with aspirations every 5 mL.  Performed by: Personally   Additional Notes: BP cuff, EKG monitors applied. Sedation begun. Femoral artery palpated for location of nerve. After nerve location anesthetic injected incrementally, slowly , and after neg aspirations. Tolerated well.   Procedure Name: Intubation Date/Time: 05/30/2014 7:45 AM Performed by: Sharlene DoryWALKER, Iniya Matzek E Pre-anesthesia Checklist: Patient identified, Emergency Drugs available, Suction available, Patient being monitored and Timeout performed Patient Re-evaluated:Patient Re-evaluated prior to inductionOxygen Delivery Method: Circle system utilized Preoxygenation: Pre-oxygenation with 100% oxygen Intubation Type: IV induction Ventilation: Mask ventilation without difficulty Laryngoscope Size: Mac and 4 Grade View: Grade I Tube type: Oral Tube size: 7.0 mm Number of attempts: 1 Airway Equipment and Method: Stylet Placement Confirmation: ETT inserted through vocal cords under direct vision,  positive ETCO2 and breath sounds checked- equal and bilateral Secured at: 21 cm Tube secured with: Tape Dental Injury: Teeth and  Oropharynx as per pre-operative assessment

## 2014-05-30 NOTE — Progress Notes (Signed)
ANTICOAGULATION CONSULT NOTE - Initial Consult  Pharmacy Consult for coumadin Indication: VTE prophylaxis  Allergies  Allergen Reactions  . Other     Certain antibiotics cause yeast infections    Patient Measurements: Weight: 261 lb (118.389 kg) Heparin Dosing Weight:   Vital Signs: Temp: 97.7 F (36.5 C) (11/13 1314) Temp Source: Oral (11/13 0619) BP: 158/77 mmHg (11/13 1314) Pulse Rate: 81 (11/13 1314)  Labs:  Recent Labs  05/30/14 0629  APTT 31  LABPROT 13.5  INR 1.02    Estimated Creatinine Clearance: 91 mL/min (by C-G formula based on Cr of 0.77).   Medical History: Past Medical History  Diagnosis Date  . Fluid retention     take Lasix  . Heart murmur     since a child  . Hypertension   . H/O seasonal allergies   . Headache     sinus especially right side  . Arthritis     Medications:  Scheduled:  .  ceFAZolin (ANCEF) IV  2 g Intravenous Q6H  . celecoxib  200 mg Oral Q12H  . [START ON 05/31/2014] enoxaparin (LOVENOX) injection  30 mg Subcutaneous Q12H  . furosemide  20 mg Oral Daily  . HYDROmorphone      . HYDROmorphone      . [START ON 05/31/2014] loratadine  10 mg Oral Daily  . multivitamin with minerals  1 tablet Oral Daily  . potassium chloride SA  20 mEq Oral Daily   Infusions:  . sodium chloride 50 mL/hr at 05/30/14 1425    Assessment: 63 yo who is s/p TKR. She was not on any anticoagulant PTA. Coumadin has been ordered for DVT prophylaxis. Baseline INR is 1.   Goal of Therapy:  INR 2-3 Monitor platelets by anticoagulation protocol: Yes   Plan:   Coumadin 10mg  PO x1 Daily INR  Ulyses SouthwardMinh Lottie Sigman, PharmD Pager: [phone] 05/30/2014 2:33 PM

## 2014-05-30 NOTE — Plan of Care (Signed)
Problem: Consults Goal: Diagnosis- Total Joint Replacement Primary Total Knee Right     

## 2014-05-30 NOTE — Anesthesia Preprocedure Evaluation (Addendum)
Anesthesia Evaluation  Patient identified by MRN, date of birth, ID band Patient awake    Reviewed: Allergy & Precautions, H&P , NPO status , Patient's Chart, lab work & pertinent test results  Airway Mallampati: II       Dental  (+) Teeth Intact   Pulmonary former smoker,  breath sounds clear to auscultation        Cardiovascular hypertension, Rhythm:Regular Rate:Normal     Neuro/Psych    GI/Hepatic   Endo/Other  Morbid obesity  Renal/GU      Musculoskeletal  (+) Arthritis -,   Abdominal (+) + obese,   Peds  Hematology   Anesthesia Other Findings   Reproductive/Obstetrics                            Anesthesia Physical Anesthesia Plan  ASA: III  Anesthesia Plan: Regional and General   Post-op Pain Management:    Induction: Intravenous  Airway Management Planned: Oral ETT  Additional Equipment:   Intra-op Plan:   Post-operative Plan: Extubation in OR  Informed Consent: I have reviewed the patients History and Physical, chart, labs and discussed the procedure including the risks, benefits and alternatives for the proposed anesthesia with the patient or authorized representative who has indicated his/her understanding and acceptance.   Dental advisory given  Plan Discussed with: CRNA and Surgeon  Anesthesia Plan Comments:         Anesthesia Quick Evaluation

## 2014-05-31 LAB — BASIC METABOLIC PANEL
Anion gap: 11 (ref 5–15)
BUN: 10 mg/dL (ref 6–23)
CALCIUM: 8.2 mg/dL — AB (ref 8.4–10.5)
CHLORIDE: 96 meq/L (ref 96–112)
CO2: 28 mEq/L (ref 19–32)
Creatinine, Ser: 0.7 mg/dL (ref 0.50–1.10)
GFR calc Af Amer: >90 mL/min (ref >90)
GFR calc non Af Amer: >90 mL/min (ref >90)
GLUCOSE: 135 mg/dL — AB (ref 70–99)
Potassium: 3.5 mEq/L — ABNORMAL LOW (ref 3.7–5.3)
SODIUM: 135 meq/L — AB (ref 137–147)

## 2014-05-31 LAB — PROTIME-INR
INR: 1.18 (ref 0.00–1.49)
Prothrombin Time: 15.2 seconds (ref 11.6–15.2)

## 2014-05-31 LAB — CBC
HCT: 34.1 % — ABNORMAL LOW (ref 36.0–46.0)
HEMOGLOBIN: 10.7 g/dL — AB (ref 12.0–15.0)
MCH: 27.5 pg (ref 26.0–34.0)
MCHC: 31.4 g/dL (ref 30.0–36.0)
MCV: 87.7 fL (ref 78.0–100.0)
Platelets: 299 10*3/uL (ref 150–400)
RBC: 3.89 MIL/uL (ref 3.87–5.11)
RDW: 14 % (ref 11.5–15.5)
WBC: 12.9 10*3/uL — AB (ref 4.0–10.5)

## 2014-05-31 MED ORDER — WARFARIN SODIUM 10 MG PO TABS
10.0000 mg | ORAL_TABLET | Freq: Once | ORAL | Status: AC
  Administered 2014-05-31: 10 mg via ORAL
  Filled 2014-05-31: qty 1

## 2014-05-31 NOTE — Clinical Social Work Psychosocial (Signed)
Clinical Social Work Department BRIEF PSYCHOSOCIAL ASSESSMENT 05/31/2014  Patient:  YANIA, BOGIE     Account Number:  [id number]     Admit date:  05/30/2014  Clinical Social Worker:  Delrae Sawyers  Date/Time:  05/31/2014 03:56 PM  Referred by:  Physician  Date Referred:  05/31/2014 Referred for  SNF Placement   Other Referral:   none.   Interview type:  Patient Other interview type:   Pt's friends/neighbors present at bedside.    PSYCHOSOCIAL DATA Living Status:  ALONE Admitted from facility:   Level of care:   Primary support name:  None given. Primary support relationship to patient:  NEIGHBOR Degree of support available:   Patient states pt will be discharging home with neighbor after SNF placement.    CURRENT CONCERNS Current Concerns  Post-Acute Placement   Other Concerns:   none.    SOCIAL WORK ASSESSMENT / PLAN Weekend CSW met with pt at bedside to discuss discharge disposition. Patient stated pt from home alone, but has support from pt's neighbors. Patient prefers to be discharged to [facility] once medically stable for discharge.    Unit CSW to continue to follow and assist with discharge planning.   Assessment/plan status:  Psychosocial Support/Ongoing Assessment of Needs Other assessment/ plan:   none.   Information/referral to community resources:   [facility] bed offers.  [place] [facility].    PATIENT'S/FAMILY'S RESPONSE TO PLAN OF CARE: Pt understanding and agreeable to CSW plan of care. Pt expressed no further questions or concerns at this time.       Lubertha Sayres, [place] [phone]) Licensed Clinical Social Worker Orthopedics [phone]) and Surgical [phone])

## 2014-05-31 NOTE — Evaluation (Signed)
Occupational Therapy Evaluation and Discharge Patient Details Name: Sophia Lowery MRN: [id number] DOB: Jan 06, 1951 Today's Date: 05/31/2014    History of Present Illness Patient is a 63 y/o female s/p R TKA. WBAT RLE. PMH of HTN, heart murmur and HA.   Clinical Impression   This 63 yo female admitted and underwent above presents to acute OT with decreased mobility, increased pain, decreased balance, obesity all affecting her ability to care for herself at an independent level as she did pta. She will benefit from continued OT at SNF, acute OT will sign off.    Follow Up Recommendations  SNF    Equipment Recommendations   (TBD at SNF)       Precautions / Restrictions Precautions Precautions: Knee;Fall Required Braces or Orthoses: Knee Immobilizer - Right Knee Immobilizer - Right: On when out of bed or walking Restrictions Weight Bearing Restrictions: No RLE Weight Bearing: Weight bearing as tolerated      Mobility Bed Mobility Overal bed mobility: Needs Assistance Bed Mobility: Sit to Supine       Sit to supine: Mod assist (with use of rails)      Transfers Overall transfer level: Needs assistance Equipment used: Rolling walker (2 wheeled) Transfers: Sit to/from [employer]Stand;Stand Pivot Transfers Sit to Stand: Min assist Stand pivot transfers: Min assist       General transfer comment: VCs for safe hand placement         ADL Overall ADL's : Needs assistance/impaired Eating/Feeding: Independent;Sitting   Grooming: Set up;Sitting   Upper Body Bathing: Set up;Sitting   Lower Body Bathing: Total assistance (with min A sit<>stand)   Upper Body Dressing : Set up;Sitting   Lower Body Dressing: Total assistance (with min A sit<>stand)   Toilet Transfer: Minimal assistance;Stand-pivot;RW;BSC (with increased time and effort)   Toileting- Clothing Manipulation and Hygiene: Maximal assistance (with min A sit<>stand)                         Pertinent  Vitals/Pain Pain Assessment: 0-10 Pain Score: 10-Worst pain ever Pain Location: right knee Pain Descriptors / Indicators: Aching;Sore Pain Intervention(s): Monitored during session;Premedicated before session;Repositioned     Hand Dominance  right   Extremity/Trunk Assessment Upper Extremity Assessment Upper Extremity Assessment: Overall WFL for tasks assessed              Cognition Arousal/Alertness: Awake/alert Behavior During Therapy: WFL for tasks assessed/performed Overall Cognitive Status: Within Functional Limits for tasks assessed                                Home Living Family/patient expects to be discharged to:: Skilled nursing facility                                             OT Diagnosis: Generalized weakness;Acute pain   OT Problem List: Decreased strength;Decreased range of motion;Decreased activity tolerance;Impaired balance (sitting and/or standing);Pain;Obesity;Decreased knowledge of use of DME or AE      OT Goals(Current goals can be found in the care plan section) Acute Rehab OT Goals Patient Stated Goal: to go to rehab then home  OT Frequency:                End of Session Equipment Utilized During Treatment: Gait belt;Rolling walker  Activity  Tolerance: Patient limited by fatigue;Patient limited by pain Patient left: in bed;with call bell/phone within reach   Time: [id number] OT Time Calculation (min): 25 min Charges:  OT General Charges $OT Visit: 1 Procedure OT Evaluation $Initial OT Evaluation Tier I: 1 Procedure OT Treatments $Self Care/Home Management : 8-22 mins  Evette GeorgesLeonard, Mahki Spikes [name] [phone] 05/31/2014, 2:56 PM

## 2014-05-31 NOTE — Clinical Social Work Placement (Addendum)
Clinical Social Work Department CLINICAL SOCIAL WORK PLACEMENT NOTE 05/31/2014  Patient:  Sophia Lowery,Sophia Lowery  Account Number:  [id number] Admit date:  05/30/2014  Clinical Social Worker:  Mosie EpsteinEMILY S VAUGHN, LCSWA  Date/time:  05/31/2014 04:03 PM  Clinical Social Work is seeking post-discharge placement for this patient at the following level of care:   SKILLED NURSING   (*CSW will update this form in Epic as items are completed)   05/31/2014  [facility]/family provided with [facility] of Clinical [facility] Work's list of facilities offering this level of care within the geographic area requested by the patient (or if unable, by the patient's family).  05/31/2014  Patient/family informed of their freedom to choose among providers that offer the needed level of care, that participate in Medicare, Medicaid or managed care program needed by the patient, have an available bed and are willing to accept the patient.  05/31/2014  Patient/family informed of [facility]' ownership interest in [facility], as well as of the fact that they are under no obligation to receive care at this facility.  PASARR submitted to EDS on 05/31/2014 PASARR number received on   FL2 transmitted to all facilities in geographic area requested by pt/family on  05/31/2014 FL2 transmitted to all facilities within larger geographic area on   Patient informed that his/her managed care company has contracts with or will negotiate with  certain facilities, including the following:     Patient/family informed of bed offers received:  06[facility]02[facility]2014 [facility] Physician recommends and patient chooses bed at  n/a [facility]  [facility]/2014 Patient to be transferred to facility by [facility] Patient and family notified of transfer on 06/02/2014 pt is alert and oriented x4 requests no additional calls to be made Name of family member notified:  Patient  only  The following physician request were entered in Epic:   Additional Comments:  Marcelline Deistmily Vaughn, Theresia MajorsLCSWA [phone]) Licensed Clinical Social Worker Orthopedics [phone]([id number]) and Surgical [phone])

## 2014-05-31 NOTE — Plan of Care (Signed)
Problem: Phase II Progression Outcomes Goal: Ambulates Outcome: Progressing Goal: Tolerating diet Outcome: Progressing     

## 2014-05-31 NOTE — Plan of Care (Signed)
Problem: Discharge Progression Outcomes Goal: Tolerates diet Outcome: Completed/Met Date Met:  05/31/14

## 2014-05-31 NOTE — Plan of Care (Signed)
Problem: Phase II Progression Outcomes Goal: Tolerating diet Outcome: Completed/Met Date Met:  05/31/14 Goal: Discharge plan established Outcome: Progressing

## 2014-05-31 NOTE — Progress Notes (Signed)
Physical Therapy Treatment Patient Details Name: Sophia CurryRena T Treadwell MRN: [id number] DOB: 1950/12/26 Today's Date: 05/31/2014    History of Present Illness Patient is a 63 y/o female s/p R TKA. WBAT RLE. PMH of HTN, heart murmur and HA.    PT Comments    Pt was able to walk a short trip and [facility], with good practice to stand from lower height chairs.  Pt is uncomfortable and with immobilizer off in bed could relax to sleep.  Plan in place for SNF on Monday due to limited gait distances and continued persistent pain.  Follow Up Recommendations  SNF;Supervision/Assistance - 24 hour     Equipment Recommendations       Recommendations for Other Services OT consult     Precautions / Restrictions Precautions Precautions: Knee;Fall Precaution Booklet Issued: Yes (comment) Knee Immobilizer - Right: On when out of bed or walking Restrictions Weight Bearing Restrictions: Yes RLE Weight Bearing: Weight bearing as tolerated Other Position/Activity Restrictions: removed to get to bed    Mobility  Bed Mobility Overal bed mobility: Needs Assistance Bed Mobility: Sit to Supine     Supine to sit: Min assist Sit to supine: Min assist   General bed mobility comments: leveled bed and used L bedrail  Transfers Overall transfer level: Needs assistance Equipment used: Rolling walker (2 wheeled) Transfers: Sit to/from [employer]Stand;Stand Pivot Transfers Sit to Stand: Min assist;From elevated surface Stand pivot transfers: Min assist       General transfer comment: Min to stand and pt maneuvered her RLE with brace to get back to bed.  Once steady could control with cga   Ambulation/Gait Ambulation/Gait assistance: Min assist Ambulation Distance (Feet): 5 Feet Assistive device: Rolling walker (2 wheeled) Gait Pattern/deviations: Step-to pattern;Decreased step length - right;Decreased step length - left;Decreased weight shift to right;Antalgic;Wide base of support;Trunk  flexed Gait velocity: slow Gait velocity interpretation: Below normal speed for age/gender General Gait Details: Cued her to shorten RLE step and step past with LLE   Stairs            Wheelchair Mobility    Modified Rankin (Stroke Patients Only)       Balance Overall balance assessment: Needs assistance Sitting-balance support: Feet supported Sitting balance-Leahy Scale: Fair   Postural control: Posterior lean Standing balance support: Bilateral upper extremity supported Standing balance-Leahy Scale: Fair                      Cognition Arousal/Alertness: Awake/alert Behavior During Therapy: WFL for tasks assessed/performed Overall Cognitive Status: Within Functional Limits for tasks assessed                      Exercises Total Joint Exercises Ankle Circles/Pumps: Both;10 reps;Supine    General Comments General comments (skin integrity, edema, etc.): Increased tolerance for mobility despite her pain levels today at second visit although pain was reason to get back to bed      Pertinent Vitals/Pain Pain Assessment: Faces Pain Score: 4  Faces Pain Scale: Hurts little more Pain Location: R knee Pain Intervention(s): Limited activity within patient's tolerance;Premedicated before session;RN gave pain meds during session    Home Living                      Prior Function            PT Goals (current goals can now be found in the care plan section) Acute Rehab  PT Goals Patient Stated Goal: [place] for rehab Progress towards PT goals: Progressing toward goals    Frequency  7X/week    PT Plan Current plan remains appropriate    Co-evaluation             End of Session Equipment Utilized During Treatment: Other (comment) (FWW) Activity Tolerance: Patient tolerated treatment well;Other (comment) (O2 removed at 100% and was 98% after gait with out O2) Patient left: in chair;with call bell/phone within reach     Time:  0941-1007 PT Time Calculation (min) (ACUTE ONLY): 26 min  Charges:  $Gait Training: 8-22 mins $Therapeutic Activity: 8-22 mins                    G Codes:      Ivar DrapeStout, Summers Buendia E 05/31/2014, 10:46 AM   Samul Dadauth Sostenes Kauffmann, PT MS Acute Rehab Dept. Number: [phone]

## 2014-05-31 NOTE — Progress Notes (Signed)
Orthopedics Progress Note  Subjective: Patient in some pain this morning  Objective:  Filed Vitals:   05/31/14 0542  BP: 121/75  Pulse: 85  Temp: 97.7 F (36.5 C)  Resp: 16    General: Awake and alert  Musculoskeletal: right knee dressing CDI, patient in CPM and tolerating that well Neurovascularly intact  Lab Results  Component Value Date   WBC 12.9* 05/31/2014   HGB 10.7* 05/31/2014   HCT 34.1* 05/31/2014   MCV 87.7 05/31/2014   PLT 299 05/31/2014       Component Value Date/Time   NA 135* 05/31/2014 0558   K 3.5* 05/31/2014 0558   CL 96 05/31/2014 0558   CO2 28 05/31/2014 0558   GLUCOSE 135* 05/31/2014 0558   BUN 10 05/31/2014 0558   CREATININE 0.70 05/31/2014 0558   CALCIUM 8.2* 05/31/2014 0558   GFRNONAA >90 05/31/2014 0558   GFRAA >90 05/31/2014 0558    Lab Results  Component Value Date   INR 1.18 05/31/2014   INR 1.02 05/30/2014    Assessment/Plan: POD #1 s/p Procedure(s): RIGHT TOTAL KNEE ARTHROPLASTY PT, OT, DVT prophylaxis mechanical and Lovenox/Coumadin OOB Mobilization WBAT SNF Monday  Almedia BallsSteven R. Ranell PatrickNorris, MD 05/31/2014 7:43 AM

## 2014-05-31 NOTE — Progress Notes (Signed)
Physical Therapy Treatment Patient Details Name: Sophia CurryRena T Karim MRN: [id number] DOB: 09/04/1950 Today's Date: 05/31/2014    History of Present Illness Patient is a 63 y/o female s/p R TKA. WBAT RLE. PMH of HTN, heart murmur and HA.    PT Comments    Pt was seen for evaluation of current function and has made tremendous strides since her first visit.  Plan in place for SNF which is appropriate as she is still weak and painful in R knee, limiting her independence for home.  See BID.  Follow Up Recommendations  SNF;Supervision/Assistance - 24 hour     Equipment Recommendations       Recommendations for Other Services OT consult     Precautions / Restrictions Precautions Precautions: Knee;Fall Precaution Booklet Issued: Yes (comment) Knee Immobilizer - Right: On when out of bed or walking Restrictions Weight Bearing Restrictions: Yes RLE Weight Bearing: Weight bearing as tolerated Other Position/Activity Restrictions: elevated RLE in chair    Mobility  Bed Mobility Overal bed mobility: Needs Assistance Bed Mobility: Supine to Sit     Supine to sit: Min assist     General bed mobility comments: HOB elevated, used bedrails and PT assisted with her brace  Transfers Overall transfer level: Needs assistance Equipment used: Rolling walker (2 wheeled) Transfers: Sit to/from [employer]Stand;Stand Pivot Transfers Sit to Stand: Min assist;From elevated surface Stand pivot transfers: Min assist       General transfer comment: Min to stand and pt maneuvered her RLE with brace to get back to bed.  Once steady could control with cga   Ambulation/Gait Ambulation/Gait assistance: Min assist Ambulation Distance (Feet): 7 Feet Assistive device: Rolling walker (2 wheeled) Gait Pattern/deviations: Step-to pattern;Decreased step length - right;Decreased step length - left;Decreased weight shift to right;Antalgic;Wide base of support;Trunk flexed Gait velocity: slow Gait velocity  interpretation: Below normal speed for age/gender General Gait Details: Cued her to shorten RLE step and step past with LLE   Stairs            Wheelchair Mobility    Modified Rankin (Stroke Patients Only)       Balance Overall balance assessment: Needs assistance Sitting-balance support: Feet supported;Bilateral upper extremity supported Sitting balance-Leahy Scale: Poor   Postural control: Posterior lean Standing balance support: Bilateral upper extremity supported Standing balance-Leahy Scale: Poor                      Cognition Arousal/Alertness: Awake/alert Behavior During Therapy: WFL for tasks assessed/performed Overall Cognitive Status: Within Functional Limits for tasks assessed                      Exercises Total Joint Exercises Ankle Circles/Pumps: Both;10 reps;Supine    General Comments General comments (skin integrity, edema, etc.): Pt was so painful she could not tolerate much movement with RLE and has not reviewed ther ex as a result, but was in CPM when PT arrived      Pertinent Vitals/Pain Pain Assessment: Faces Pain Score: 6  Faces Pain Scale: Hurts even more Pain Location: R knee  Pain Intervention(s): Limited activity within patient's tolerance;Premedicated before session    Home Living                      Prior Function            PT Goals (current goals can now be found in the care plan section) Acute Rehab PT Goals Patient Stated Goal:  [place] for rehab Progress towards PT goals: Progressing toward goals    Frequency  7X/week    PT Plan Current plan remains appropriate    Co-evaluation             End of Session Equipment Utilized During Treatment: Other (comment) (FWW) Activity Tolerance: Patient tolerated treatment well;Other (comment) (O2 removed at 100% and was 98% after gait with out O2) Patient left: in chair;with call bell/phone within reach     Time: 0832-0858 PT Time Calculation  (min) (ACUTE ONLY): 26 min  Charges:  $Gait Training: 8-22 mins $Therapeutic Activity: 8-22 mins                    G Codes:      Ivar DrapeStout, Jerimah Witucki E 05/31/2014, 9:41 AM   Samul Dadauth Makala Fetterolf, PT MS Acute Rehab Dept. Number: [phone]

## 2014-05-31 NOTE — Progress Notes (Signed)
ANTICOAGULATION CONSULT NOTE - Follow Up Consult  Pharmacy Consult for coumadin Indication: VTE prophylaxis  Allergies  Allergen Reactions  . Other     Certain antibiotics cause yeast infections   Patient Measurements: Weight: 261 lb (118.389 kg)  Vital Signs: Temp: 97.7 F (36.5 C) (11/14 0542) Temp Source: Oral (11/14 0542) BP: 121/75 mmHg (11/14 0542) Pulse Rate: 85 (11/14 0542)  Labs:  Recent Labs  05/30/14 0629 05/30/14 1424 05/31/14 0558  HGB  --  12.5 10.7*  HCT  --  39.5 34.1*  PLT  --  251 299  APTT 31  --   --   LABPROT 13.5  --  15.2  INR 1.02  --  1.18  CREATININE  --  0.66 0.70   Estimated Creatinine Clearance: 91 mL/min (by C-G formula based on Cr of 0.7).  Medical History: Past Medical History  Diagnosis Date  . Fluid retention     take Lasix  . Heart murmur     since a child  . Hypertension   . H/O seasonal allergies   . Headache     sinus especially right side  . Arthritis    Medications:  Scheduled:  . celecoxib  200 mg Oral Q12H  . enoxaparin (LOVENOX) injection  30 mg Subcutaneous Q12H  . furosemide  20 mg Oral Daily  . loratadine  10 mg Oral Daily  . multivitamin with minerals  1 tablet Oral Daily  . potassium chloride SA  20 mEq Oral Daily  . warfarin   Does not apply Once  . Warfarin - Pharmacist Dosing Inpatient   Does not apply q1800   Infusions:  . sodium chloride 50 mL/hr at 05/30/14 1425   Assessment: 63 yo female who is s/p right TKA yesterday due to end stage osteoarthritis.  She received one dose of oral Warfarin 10mg  last evening.  She is also on Lovenox 30mg  sq. every 12 hours for DVT prevention.  She has morbid obesity with a BMI of 46.  She is also taking Celecoxib which can cause an increased risk for bleeding.  She has some post-op anemia with a H/H of 10.7/34.1.  No noted bleeding complications and her platelets are stable.  Her PT/INR is 15.2/1.18 and is trending upward.  She has received the Warfarin teaching  booklet and has been ordered the instructional video.  We will plan to counsel her on this high risk medication prior to discharge.  Goal of Therapy:  INR 2-3 Monitor platelets by anticoagulation protocol: Yes   Plan:   Coumadin 10mg  PO x1 Daily INR  Nadara MustardNita Princetta Uplinger, PharmD., MS Clinical Pharmacist Pager:  [phone] Thank you for allowing pharmacy to be part of this patients care team. 05/31/2014 7:17 AM

## 2014-05-31 NOTE — Progress Notes (Signed)
Orthopedic Tech Progress Note Patient Details:  Gayland CurryRena T Pickerel 10/03/1950 [id number]  Patient ID: Gayland Curryena T Whichard, female   DOB: 10/03/1950, 63 y.o.   MRN: [id number] Placed pt's rle in cpm @0 -60 degrees @1510   Nikki DomCrawford, Odeal Welden 05/31/2014, 3:20 PM

## 2014-06-01 LAB — CBC
HCT: 30.5 % — ABNORMAL LOW (ref 36.0–46.0)
HEMOGLOBIN: 9.7 g/dL — AB (ref 12.0–15.0)
MCH: 27.9 pg (ref 26.0–34.0)
MCHC: 31.8 g/dL (ref 30.0–36.0)
MCV: 87.6 fL (ref 78.0–100.0)
PLATELETS: 260 10*3/uL (ref 150–400)
RBC: 3.48 MIL/uL — AB (ref 3.87–5.11)
RDW: 14.2 % (ref 11.5–15.5)
WBC: 11.6 10*3/uL — ABNORMAL HIGH (ref 4.0–10.5)

## 2014-06-01 LAB — PROTIME-INR
INR: 1.45 (ref 0.00–1.49)
Prothrombin Time: 17.8 seconds — ABNORMAL HIGH (ref 11.6–15.2)

## 2014-06-01 MED ORDER — WARFARIN SODIUM 10 MG PO TABS
10.0000 mg | ORAL_TABLET | Freq: Once | ORAL | Status: AC
  Administered 2014-06-01: 10 mg via ORAL
  Filled 2014-06-01: qty 1

## 2014-06-01 NOTE — Discharge Instructions (Signed)
Ice to the knee at all times.  Do not prop anything under the knee.  Prop under the ankle to encourage a straight knee.  Wear the knee immobilizer at night time and while up walking if therapy wants you to.  Keep the incision clean and dry for one week, then ok to get wet in the shower.  Follow up in the office in two weeks [phone]  Information on my medicine - Coumadin   (Warfarin)  This medication education was reviewed with me or my healthcare representative as part of my discharge preparation.  The pharmacist that spoke with me during my hospital stay was:  Chinita GreenlandJohnston, Gerene Nedd Faye, [facility] CenterRPH  Why was Coumadin prescribed for you? Coumadin was prescribed for you because you have a blood clot or a medical condition that can cause an increased risk of forming blood clots. Blood clots can cause serious health problems by blocking the flow of blood to the heart, lung, or brain. Coumadin can prevent harmful blood clots from forming. As a reminder your indication for Coumadin is:   Blood Clot Prevention After Orthopedic Surgery  What test will check on my response to Coumadin? While on Coumadin (warfarin) you will need to have an INR test regularly to ensure that your dose is keeping you in the desired range. The INR (international normalized ratio) number is calculated from the result of the laboratory test called prothrombin time (PT).  If an INR APPOINTMENT HAS NOT ALREADY BEEN MADE FOR YOU please schedule an appointment to have this lab work done by your health care provider within 7 days. Your INR goal is usually a number between:  2 to 3 or your provider may give you a more narrow range like 2-2.5.  Ask your health care provider during an office visit what your goal INR is.  What  do you need to  know  About  COUMADIN? Take Coumadin (warfarin) exactly as prescribed by your healthcare provider about the same time each day.  DO NOT stop taking without talking to the doctor who prescribed the  medication.  Stopping without other blood clot prevention medication to take the place of Coumadin may increase your risk of developing a new clot or stroke.  Get refills before you run out.  What do you do if you miss a dose? If you miss a dose, take it as soon as you remember on the same day then continue your regularly scheduled regimen the next day.  Do not take two doses of Coumadin at the same time.  Important Safety Information A possible side effect of Coumadin (Warfarin) is an increased risk of bleeding. You should call your healthcare provider right away if you experience any of the following: ? Bleeding from an injury or your nose that does not stop. ? Unusual colored urine (red or dark brown) or unusual colored stools (red or black). ? Unusual bruising for unknown reasons. ? A serious fall or if you hit your head (even if there is no bleeding).  Some foods or medicines interact with Coumadin (warfarin) and might alter your response to warfarin. To help avoid this: ? Eat a balanced diet, maintaining a consistent amount of Vitamin K. ? Notify your provider about major diet changes you plan to make. ? Avoid alcohol or limit your intake to 1 drink for women and 2 drinks for men per day. (1 drink is 5 oz. wine, 12 oz. beer, or 1.5 oz. liquor.)  Make sure that ANY health care  provider who prescribes medication for you knows that you are taking Coumadin (warfarin).  Also make sure the healthcare provider who is monitoring your Coumadin knows when you have started a new medication including herbals and non-prescription products.  Coumadin (Warfarin)  Major Drug Interactions  Increased Warfarin Effect Decreased Warfarin Effect  Alcohol (large quantities) Antibiotics (esp. Septra/Bactrim, Flagyl, Cipro) Amiodarone (Cordarone) Aspirin (ASA) Cimetidine (Tagamet) Megestrol (Megace) NSAIDs (ibuprofen, naproxen, etc.) Piroxicam (Feldene) Propafenone (Rythmol SR) Propranolol  (Inderal) Isoniazid (INH) Posaconazole (Noxafil) Barbiturates (Phenobarbital) Carbamazepine (Tegretol) Chlordiazepoxide (Librium) Cholestyramine (Questran) Griseofulvin Oral Contraceptives Rifampin Sucralfate (Carafate) Vitamin K   Coumadin (Warfarin) Major Herbal Interactions  Increased Warfarin Effect Decreased Warfarin Effect  Garlic Ginseng Ginkgo biloba Coenzyme Q10 Green tea St. Johns wort    Coumadin (Warfarin) FOOD Interactions  Eat a consistent number of servings per week of foods HIGH in Vitamin K (1 serving =  cup)  Collards (cooked, or boiled & drained) Kale (cooked, or boiled & drained) Mustard greens (cooked, or boiled & drained) Parsley *serving size only =  cup Spinach (cooked, or boiled & drained) Swiss chard (cooked, or boiled & drained) Turnip greens (cooked, or boiled & drained)  Eat a consistent number of servings per week of foods MEDIUM-HIGH in Vitamin K (1 serving = 1 cup)  Asparagus (cooked, or boiled & drained) Broccoli (cooked, boiled & drained, or raw & chopped) Brussel sprouts (cooked, or boiled & drained) *serving size only =  cup Lettuce, raw (green leaf, endive, romaine) Spinach, raw Turnip greens, raw & chopped   These websites have more information on Coumadin (warfarin):  http://www.king-russell.com/www.coumadin.com; https://www.hines.net/www.ahrq.gov/consumer/coumadin.htm;

## 2014-06-01 NOTE — Progress Notes (Addendum)
ANTICOAGULATION CONSULT NOTE - Follow Up Consult  Pharmacy Consult for coumadin Indication: VTE prophylaxis  Allergies  Allergen Reactions  . Other     Certain antibiotics cause yeast infections   Patient Measurements: Weight: 261 lb (118.389 kg)  Vital Signs: Temp: 98.4 F (36.9 C) (11/15 0647) Temp Source: Oral (11/15 0647) BP: 122/69 mmHg (11/15 0647) Pulse Rate: 85 (11/15 0647)  Labs:  Recent Labs  05/30/14 0629  05/30/14 1424 05/31/14 0558 06/01/14 0605  HGB  --   < > 12.5 10.7* 9.7*  HCT  --   --  39.5 34.1* 30.5*  PLT  --   --  251 299 260  APTT 31  --   --   --   --   LABPROT 13.5  --   --  15.2 17.8*  INR 1.02  --   --  1.18 1.45  CREATININE  --   --  0.66 0.70  --   < > = values in this interval not displayed. Estimated Creatinine Clearance: 91 mL/min (by C-G formula based on Cr of 0.7).  Medical History: Past Medical History  Diagnosis Date  . Fluid retention     take Lasix  . Heart murmur     since a child  . Hypertension   . H/O seasonal allergies   . Headache     sinus especially right side  . Arthritis    Medications:  Scheduled:  . celecoxib  200 mg Oral Q12H  . enoxaparin (LOVENOX) injection  30 mg Subcutaneous Q12H  . furosemide  20 mg Oral Daily  . loratadine  10 mg Oral Daily  . multivitamin with minerals  1 tablet Oral Daily  . potassium chloride SA  20 mEq Oral Daily  . Warfarin - Pharmacist Dosing Inpatient   Does not apply q1800   Infusions:  . sodium chloride 50 mL/hr at 05/30/14 1425   Assessment: 63 yo female who is s/p right TKA yesterday due to end stage osteoarthritis.  She is receiving Lovenox and Warfarin for DVT prevention.  Her INR is trending upward nicely after two doses of Warfarin 10mg .  I reviewed her medications this morning and she is taking Celecoxib which can cause an increased risk for bleeding but no other major drug/drug interacting medications identified.  She has some post-op anemia with a H/H of 9.7/30.5  and a normal platelet.  No noted bleeding complications noted.  Her PT/INR is 17.8/1.45 and is trending upward.  She has received the Warfarin teaching booklet and has been ordered the instructional video.  Goal of Therapy:  INR 2-3 Monitor platelets by anticoagulation protocol: Yes   Plan:  Consider alternative pain meds instead of Celecoxib while on Warfarin if you feel her risk for bleeding are high Coumadin 10mg  PO x1 Daily INR  Nadara MustardNita Kourtnee Lahey, PharmD., MS Clinical Pharmacist Pager:  [phone] Thank you for allowing pharmacy to be part of this patients care team. 06/01/2014 7:22 AM   Provided face to face Warfarin teaching - patient demonstrated understanding of the need for this therapy.  Suggested she speak with her physician regarding the duration of this therapy.  Left instructional material for her.  Nadara MustardNita Haik Mahoney, PharmD., MS Pager:  [phone]

## 2014-06-01 NOTE — Progress Notes (Signed)
   Subjective: 2 Days Post-Op Procedure(s) (LRB): RIGHT TOTAL KNEE ARTHROPLASTY (Right) Patient reports pain as mild and moderate.   Patient seen in rounds with Dr. Lequita HaltAluisio. Working with therapy at this time. Patient is well, but has had some minor complaints of pain in the knee, requiring pain medications Plan is to go Skilled nursing facility after hospital stay.  Plan for tomorrow.  Objective: Vital signs in last 24 hours: Temp:  [98.4 F (36.9 C)-98.7 F (37.1 C)] 98.4 F (36.9 C) (11/15 0647) Pulse Rate:  [80-91] 85 (11/15 0647) Resp:  [16-18] 16 (11/15 0800) BP: (122-145)/(53-69) 122/69 mmHg (11/15 0647) SpO2:  [96 %-100 %] 100 % (11/15 0800)  Intake/Output from previous day:  Intake/Output Summary (Last 24 hours) at 06/01/14 1022 Last data filed at 06/01/14 0400  Gross per 24 hour  Intake    720 ml  Output    300 ml  Net    420 ml   Labs:  Recent Labs  05/30/14 1424 05/31/14 0558 06/01/14 0605  HGB 12.5 10.7* 9.7*    Recent Labs  05/31/14 0558 06/01/14 0605  WBC 12.9* 11.6*  RBC 3.89 3.48*  HCT 34.1* 30.5*  PLT 299 260    Recent Labs  05/30/14 1424 05/31/14 0558  NA  --  135*  K  --  3.5*  CL  --  96  CO2  --  28  BUN  --  10  CREATININE 0.66 0.70  GLUCOSE  --  135*  CALCIUM  --  8.2*    Recent Labs  05/31/14 0558 06/01/14 0605  INR 1.18 1.45    EXAM General - Patient is Alert, Appropriate and Oriented Extremity - Neurovascular intact Sensation intact distally Dressing/Incision - clean, dry, no drainage Motor Function - intact, moving foot and toes well on exam.   Past Medical History  Diagnosis Date  . Fluid retention     take Lasix  . Heart murmur     since a child  . Hypertension   . H/O seasonal allergies   . Headache     sinus especially right side  . Arthritis     Assessment/Plan: 2 Days Post-Op Procedure(s) (LRB): RIGHT TOTAL KNEE ARTHROPLASTY (Right) Active Problems:   Degenerative arthritis of right  knee  Estimated body mass index is 46.95 kg/(m^2) as calculated from the following:   Height as of 05/26/14: 5' 2.5" (1.588 m).   Weight as of this encounter: 118.389 kg (261 lb). Up with therapy Plan for discharge tomorrow Discharge to SNF  DVT Prophylaxis - Lovenox and Coumadin Weight-Bearing as tolerated to right leg  Avel Peacerew Perkins, PA-C Orthopaedic Surgery 06/01/2014, 10:22 AM

## 2014-06-01 NOTE — Progress Notes (Signed)
Physical Therapy Treatment Patient Details Name: Sophia Lowery MRN: [id number] DOB: 03/30/1951 Today's Date: 06/01/2014    History of Present Illness Patient is a 63 y/o female s/p R TKA. WBAT RLE. PMH of HTN, heart murmur and HA.    PT Comments    Session focused on activity tolerance and increasing ambulation distance. Pt progressing well with ambulation; more difficulty with bed mobility, likely limited by knee pain. R knee stable in single limb stance. Continue discharge planning to SNF for post acute rehab to maximize independence and safety prior to discharge home alone.   Follow Up Recommendations  SNF     Equipment Recommendations       Recommendations for Other Services       Precautions / Restrictions Precautions Precautions: Knee;Fall Required Braces or Orthoses: Knee Immobilizer - Right Knee Immobilizer - Right: On when out of bed or walking Restrictions Weight Bearing Restrictions: Yes RLE Weight Bearing: Weight bearing as tolerated    Mobility  Bed Mobility Overal bed mobility: +2 for physical assistance Bed Mobility: Supine to Sit;Sidelying to Sit   Sidelying to sit: Mod assist;+2 for physical assistance Supine to sit: Min assist;+2 for physical assistance     General bed mobility comments: lowered bed height and EOB. Assistance with R LE and used bed pad to square off hips at EOB  Transfers Overall transfer level: Needs assistance Equipment used: Rolling walker (2 wheeled) Transfers: Sit to/from Stand Sit to Stand: Min assist         General transfer comment: VC for feet flat on the floor and hand placement  Ambulation/Gait Ambulation/Gait assistance: Min assist Ambulation Distance (Feet): 50 Feet Assistive device: Rolling walker (2 wheeled) Gait Pattern/deviations: Step-to pattern (slowly transitioning to step through pattern.) Gait velocity: slow Gait velocity interpretation: Below normal speed for age/gender General Gait Details: VCs to  maintain upright posture. VC to actively extend knee. VC to keep walker at a safe distance in front of her   Stairs            Wheelchair Mobility    Modified Rankin (Stroke Patients Only)       Balance Overall balance assessment: Needs assistance Sitting-balance support: Bilateral upper extremity supported   Sitting balance - Comments: Maintains sitting EOB with BL UE support for 2 minutes Postural control: Posterior lean Standing balance support: Bilateral upper extremity supported                        Cognition Arousal/Alertness: Awake/alert Behavior During Therapy: WFL for tasks assessed/performed Overall Cognitive Status: Within Functional Limits for tasks assessed                      Exercises Total Joint Exercises Quad Sets: 10 reps;Supine;Right (10 second repetitions) Straight Leg Raises: Right;10 reps;Supine (moderate assist to lift R leg. )    General Comments General comments (skin integrity, edema, etc.): Incision site healing well. MD came to do a dressing change during PT session.      Pertinent Vitals/Pain Pain Assessment: Faces Faces Pain Scale: Hurts whole lot Pain Location: R knee Pain Descriptors / Indicators: Burning;Operative site guarding;Sharp;Tightness Pain Intervention(s): Monitored during session;Premedicated before session;Patient requesting pain meds-RN notified    Home Living                      Prior Function            PT Goals (current goals can  now be found in the care plan section) Acute Rehab PT Goals Patient Stated Goal: walking independently, go home, [place] dance Potential to Achieve Goals: Good Progress towards PT goals: Progressing toward goals    Frequency  7X/week    PT Plan Current plan remains appropriate    Co-evaluation             End of Session Equipment Utilized During Treatment: Gait belt;Right knee immobilizer (rolling walker) Activity Tolerance: Patient  limited by pain;Patient tolerated treatment well Patient left: in chair;with call bell/phone within reach     Time: 1000-1055 PT Time Calculation (min) (ACUTE ONLY): 55 min  Charges:                       G Codes:        Sophia Lowery, SPT.  Acute Rehabilitation [phone]  Sophia Lowery, Sophia 06/01/2014, 12:07 PM   Sophia Lowery, PT  Acute Rehabilitation Services Pager [phone] Office [phone]

## 2014-06-01 NOTE — Progress Notes (Signed)
Physical Therapy Treatment Patient Details Name: Sophia CurryRena T Winterrowd MRN: [id number] DOB: [birth year] Today's Date: 06/01/2014    History of Present Illness Patient is a 63 y/o female s/p R TKA. WBAT RLE. PMH of HTN, heart murmur and HA.    PT Comments    Continuing progress towards goals; needs continued work on knee strengthening and flexion range; anticipate good progress at SNF  Follow Up Recommendations  SNF     Equipment Recommendations  Rolling walker with 5" wheels;3in1 (PT)    Recommendations for Other Services       Precautions / Restrictions Precautions Precautions: Knee;Fall Precaution Comments: Pt educated to not allow any pillow or bolster under knee for healing with optimal range of motion.  Required Braces or Orthoses: Knee Immobilizer - Right Knee Immobilizer - Right: On when out of bed or walking Restrictions RLE Weight Bearing: Weight bearing as tolerated    Mobility  Bed Mobility Overal bed mobility: Needs Assistance Bed Mobility: Supine to Sit;Sit to Supine     Supine to sit: Min assist Sit to supine: Mod assist (For RLE)   General bed mobility comments: Cues for technqiue; smoother transitions than this am session  Transfers Overall transfer level: Needs assistance Equipment used: Rolling walker (2 wheeled) Transfers: Sit to/from Stand Sit to Stand: Min guard         General transfer comment: VC for feet flat on the floor and hand placement  Ambulation/Gait Ambulation/Gait assistance: Min guard Ambulation Distance (Feet): 115 Feet Assistive device: Rolling walker (2 wheeled) Gait Pattern/deviations: Step-through pattern Gait velocity: slow   General Gait Details: VCs to maintain upright posture. VC to actively extend knee. VC to keep walker at a safe distance in front of her   Stairs            Wheelchair Mobility    Modified Rankin (Stroke Patients Only)       Balance             Standing balance-Leahy Scale: Fair                       Cognition Arousal/Alertness: Awake/alert Behavior During Therapy: WFL for tasks assessed/performed Overall Cognitive Status: Within Functional Limits for tasks assessed                      Exercises Total Joint Exercises Ankle Circles/Pumps: Both;10 reps;Supine Quad Sets: AROM;Right;10 reps;Supine Short Arc Quad: AAROM;Right;10 reps;Supine Heel Slides: AAROM;Right;10 reps;Supine Straight Leg Raises: AAROM;Right;10 reps;Supine Goniometric ROM: approx 2-46 degrees    General Comments        Pertinent Vitals/Pain Pain Assessment: 0-10 Pain Score: 8  (which is an improvement over am session) Pain Location: R knee Pain Descriptors / Indicators: Aching;Burning;Other (Comment);Tightness (pulling) Pain Intervention(s): Limited activity within patient's tolerance;Monitored during session;Premedicated before session    Home Living                      Prior Function            PT Goals (current goals can now be found in the care plan section) Acute Rehab PT Goals Patient Stated Goal: walking independently, go home, [place] dance PT Goal Formulation: With patient Time For Goal Achievement: 06/13/14 Potential to Achieve Goals: Good Progress towards PT goals: Progressing toward goals    Frequency  7X/week    PT Plan Current plan remains appropriate    Co-evaluation  End of Session Equipment Utilized During Treatment: Gait belt;Right knee immobilizer Activity Tolerance: Patient tolerated treatment well;Patient limited by pain Patient left: in chair;with call bell/phone within reach     Time: 1550-1645 PT Time Calculation (min) (ACUTE ONLY): 55 min  Charges:  $Gait Training: 23-37 mins $Therapeutic Exercise: 8-22 mins $Therapeutic Activity: 8-22 mins                    G Codes:      Olen PelGarrigan, Armoni Kludt Hamff 06/01/2014, 5:40 PM  Van ClinesHolly Lilybeth Vien, [place]PT  Acute Rehabilitation Services Pager  [phone] Office [phone]

## 2014-06-02 ENCOUNTER — Encounter (HOSPITAL_COMMUNITY): Payer: Self-pay | Admitting: Orthopedic Surgery

## 2014-06-02 LAB — CBC
HCT: 28.6 % — ABNORMAL LOW (ref 36.0–46.0)
Hemoglobin: 9.1 g/dL — ABNORMAL LOW (ref 12.0–15.0)
MCH: 27.9 pg (ref 26.0–34.0)
MCHC: 31.8 g/dL (ref 30.0–36.0)
MCV: 87.7 fL (ref 78.0–100.0)
Platelets: 269 10*3/uL (ref 150–400)
RBC: 3.26 MIL/uL — ABNORMAL LOW (ref 3.87–5.11)
RDW: 14.2 % (ref 11.5–15.5)
WBC: 10.8 10*3/uL — ABNORMAL HIGH (ref 4.0–10.5)

## 2014-06-02 LAB — PROTIME-INR
INR: 1.89 — AB (ref 0.00–1.49)
Prothrombin Time: 21.9 seconds — ABNORMAL HIGH (ref 11.6–15.2)

## 2014-06-02 MED ORDER — WARFARIN SODIUM 7.5 MG PO TABS
7.5000 mg | ORAL_TABLET | Freq: Once | ORAL | Status: AC
  Administered 2014-06-02: 7.5 mg via ORAL
  Filled 2014-06-02 (×2): qty 1

## 2014-06-02 NOTE — Progress Notes (Signed)
   Subjective: 3 Days Post-Op Procedure(s) (LRB): RIGHT TOTAL KNEE ARTHROPLASTY (Right)  Pain is mild to moderate but tolerable Therapy going well thus far Patient reports pain as moderate.  Objective:   VITALS:   Filed Vitals:   06/02/14 0800  BP:   Pulse:   Temp:   Resp: 18    Right knee incision healing well nv intact distally No rashes or edema  LABS  Recent Labs  05/31/14 0558 06/01/14 0605 06/02/14 0455  HGB 10.7* 9.7* 9.1*  HCT 34.1* 30.5* 28.6*  WBC 12.9* 11.6* 10.8*  PLT 299 260 269     Recent Labs  05/30/14 1424 05/31/14 0558  NA  --  135*  K  --  3.5*  BUN  --  10  CREATININE 0.66 0.70  GLUCOSE  --  135*     Assessment/Plan: 3 Days Post-Op Procedure(s) (LRB): RIGHT TOTAL KNEE ARTHROPLASTY (Right) Plan for d/c to snf today F/u in 2 weeks Pt in agreement      General MillsBrad Kaedan Richert, MPAS, PA-C  06/02/2014, 10:12 AM

## 2014-06-02 NOTE — Progress Notes (Signed)
Pt continues to complain of pain at 10/10 and is requiring IV dilaudid for pain relief. She is concerned about the severity of the pain and that the percocet is not managing her severe pain.

## 2014-06-02 NOTE — Plan of Care (Signed)
Problem: Phase II Progression Outcomes Goal: Ambulates Outcome: Completed/Met Date Met:  06/02/14 Goal: Discharge plan established Outcome: Completed/Met Date Met:  06/02/14 Goal: Other Phase II Outcomes/Goals Outcome: Not Applicable Date Met:  63/81/77  Problem: Phase III Progression Outcomes Goal: Ambulates Outcome: Completed/Met Date Met:  06/02/14 Goal: Incision clean - minimal/no drainage Outcome: Completed/Met Date Met:  06/02/14 Goal: Discharge plan remains appropriate-arrangements made Outcome: Completed/Met Date Met:  06/02/14 Goal: Anticoagulant follow-up in place Outcome: Completed/Met Date Met:  06/02/14 Goal: Other Phase III Outcomes/Goals Outcome: Not Applicable Date Met:  11/65/79

## 2014-06-02 NOTE — Progress Notes (Signed)
Spoke to Alcoa IncVickie with [employer] Financialthe insurance company. More documentation was needed from PT for SNF placement authorization. Due to being after hours, Vickie said she would contact SW tomorrow during business hours regarding placement.

## 2014-06-02 NOTE — Discharge Summary (Signed)
Physician Discharge Summary   Patient ID: Sophia Lowery MRN: [id number] DOB/AGE: [birth year] 63 y.o.  Admit date: 05/30/2014 Discharge date: 06/02/2014  Admission Diagnoses:  Active Problems:   Degenerative arthritis of right knee   Discharge Diagnoses:  Same   Surgeries: Procedure(s): RIGHT TOTAL KNEE ARTHROPLASTY on 05/30/2014   Consultants: PT/OT  Discharged Condition: Stable  Hospital Course: Sophia Lowery is an 63 y.o. female who was admitted 05/30/2014 with a chief complaint of right knee osteoarthritis, and found to have a diagnosis of end stage right knee osteoarthritis.  They were brought to the operating room on 05/30/2014 and underwent the above named procedures.    The patient had an uncomplicated hospital course and was stable for discharge.  Recent vital signs:  Filed Vitals:   06/02/14 0800  BP:   Pulse:   Temp:   Resp: 18    Recent laboratory studies:  Results for orders placed or performed during the hospital encounter of 05/30/14  APTT  Result Value Ref Range   aPTT 31 24 - 37 seconds  Protime-INR  Result Value Ref Range   Prothrombin Time 13.5 11.6 - 15.2 seconds   INR 1.02 0.00 - 1.49  CBC  Result Value Ref Range   WBC 14.2 (H) 4.0 - 10.5 K/uL   RBC 4.63 3.87 - 5.11 MIL/uL   Hemoglobin 12.5 12.0 - 15.0 g/dL   HCT 14.739.5 82.936.0 - 56.246.0 %   MCV 85.3 78.0 - 100.0 fL   MCH 27.0 26.0 - 34.0 pg   MCHC 31.6 30.0 - 36.0 g/dL   RDW 13.013.9 86.511.5 - 78.415.5 %   Platelets 251 150 - 400 K/uL  Creatinine, serum  Result Value Ref Range   Creatinine, Ser 0.66 0.50 - 1.10 mg/dL   GFR calc non Af Amer >90 >90 mL/min   GFR calc Af Amer >90 >90 mL/min  Protime-INR  Result Value Ref Range   Prothrombin Time 15.2 11.6 - 15.2 seconds   INR 1.18 0.00 - 1.49  CBC  Result Value Ref Range   WBC 12.9 (H) 4.0 - 10.5 K/uL   RBC 3.89 3.87 - 5.11 MIL/uL   Hemoglobin 10.7 (L) 12.0 - 15.0 g/dL   HCT 69.634.1 (L) 29.536.0 - 28.446.0 %   MCV 87.7 78.0 - 100.0 fL   MCH 27.5 26.0 - 34.0  pg   MCHC 31.4 30.0 - 36.0 g/dL   RDW 13.214.0 44.011.5 - 10.215.5 %   Platelets 299 150 - 400 K/uL  Basic metabolic panel  Result Value Ref Range   Sodium 135 (L) 137 - 147 mEq/L   Potassium 3.5 (L) 3.7 - 5.3 mEq/L   Chloride 96 96 - 112 mEq/L   CO2 28 19 - 32 mEq/L   Glucose, Bld 135 (H) 70 - 99 mg/dL   BUN 10 6 - 23 mg/dL   Creatinine, Ser 7.250.70 0.50 - 1.10 mg/dL   Calcium 8.2 (L) 8.4 - 10.5 mg/dL   GFR calc non Af Amer >90 >90 mL/min   GFR calc Af Amer >90 >90 mL/min   Anion gap 11 5 - 15  Protime-INR  Result Value Ref Range   Prothrombin Time 17.8 (H) 11.6 - 15.2 seconds   INR 1.45 0.00 - 1.49  CBC  Result Value Ref Range   WBC 11.6 (H) 4.0 - 10.5 K/uL   RBC 3.48 (L) 3.87 - 5.11 MIL/uL   Hemoglobin 9.7 (L) 12.0 - 15.0 g/dL   HCT 36.630.5 (L) 44.036.0 - 34.746.0 %  MCV 87.6 78.0 - 100.0 fL   MCH 27.9 26.0 - 34.0 pg   MCHC 31.8 30.0 - 36.0 g/dL   RDW 24.414.2 01.011.5 - 27.215.5 %   Platelets 260 150 - 400 K/uL  Protime-INR  Result Value Ref Range   Prothrombin Time 21.9 (H) 11.6 - 15.2 seconds   INR 1.89 (H) 0.00 - 1.49  CBC  Result Value Ref Range   WBC 10.8 (H) 4.0 - 10.5 K/uL   RBC 3.26 (L) 3.87 - 5.11 MIL/uL   Hemoglobin 9.1 (L) 12.0 - 15.0 g/dL   HCT 53.628.6 (L) 64.436.0 - 03.446.0 %   MCV 87.7 78.0 - 100.0 fL   MCH 27.9 26.0 - 34.0 pg   MCHC 31.8 30.0 - 36.0 g/dL   RDW 74.214.2 59.511.5 - 63.815.5 %   Platelets 269 150 - 400 K/uL    Discharge Medications:     Medication List    STOP taking these medications        ibuprofen 200 MG tablet  Commonly known as:  ADVIL,MOTRIN      TAKE these medications        cetirizine 10 MG tablet  Commonly known as:  ZYRTEC  Take 10 mg by mouth daily.     furosemide 20 MG tablet  Commonly known as:  LASIX  Take 20 mg by mouth daily.     methocarbamol 500 MG tablet  Commonly known as:  ROBAXIN  Take 1 tablet (500 mg total) by mouth 3 (three) times daily as needed.     MULTIVITAMIN ADULTS 50+ PO  Take 1 tablet by mouth daily.     oxyCODONE-acetaminophen 5-325  MG per tablet  Commonly known as:  ROXICET  Take 1-2 tablets by mouth every 4 (four) hours as needed for severe pain.     potassium chloride SA 20 MEQ tablet  Commonly known as:  K-DUR,KLOR-CON  Take 20 mEq by mouth daily.     warfarin 5 MG tablet  Commonly known as:  COUMADIN  Take 1 tablet (5 mg total) by mouth daily.        Diagnostic Studies: Dg Knee Right Port  05/30/2014   CLINICAL DATA:  Post total knee replacement  EXAM: PORTABLE RIGHT KNEE - 1-2 VIEW  COMPARISON:  None.  FINDINGS: Skin staples and subcutaneous gas noted. Tripartite total knee arthroplasty in place without evidence for hardware failure. No fracture line visualized.  IMPRESSION: Expected postoperative appearance after right total knee arthroplasty.   Electronically Signed   By: Christiana PellantGretchen  Green M.D.   On: 05/30/2014 10:57    Disposition: Final discharge disposition not confirmed        Follow-up Information    Follow up with NORRIS,STEVEN R, MD. Call in 2 weeks.   Specialty:  Orthopedic Surgery   Why:  [id number]   Contact information:   [address] [place] [id number] [phone]        Signed: Thea GistDIXON,Marrisa Kimber B 06/02/2014, 10:13 AM

## 2014-06-02 NOTE — Progress Notes (Signed)
Physical Therapy Treatment Patient Details Name: Sophia Lowery MRN: [id number] DOB: 1950/12/27 Today's Date: 06/02/2014    History of Present Illness Patient is a 63 y/o female s/p R TKA. WBAT RLE. PMH of HTN, heart murmur and HA.    PT Comments    Session focused on increasing knee range of motion, assessing knee stability in stance without knee immobilizer, and ambulation. Progressing well with functional mobility as evidenced by increasing distance and activity tolerance. Showed good R LE single limb stance stability, anticipate being able to discontinue knee immobilizer soon. Pain continues to limit range of motion. Pt will benefit from SNF for post-acute rehab to maximize independence and safety with mobility prior to D/C home alone.   Follow Up Recommendations  SNF     Equipment Recommendations  Rolling walker with 5" wheels;3in1 (PT)    Recommendations for Other Services       Precautions / Restrictions Precautions Precautions: Knee Precaution Comments: Pt educated to not allow any pillow or bolster under knee for healing with optimal range of motion.  Required Braces or Orthoses: Knee Immobilizer - Right Knee Immobilizer - Right: On when out of bed or walking Restrictions Weight Bearing Restrictions: Yes RLE Weight Bearing: Weight bearing as tolerated    Mobility  Bed Mobility Overal bed mobility: Needs Assistance Bed Mobility: Supine to Sit   Sidelying to sit: Min assist       General bed mobility comments: Cues for technqiue; smoother transitions than yesterday's session; still requested support of RLE as it tranistioned off of bed.  Transfers Overall transfer level: Needs assistance Equipment used: Rolling walker (2 wheeled) Transfers: Sit to/from Stand Sit to Stand: Min guard         General transfer comment: Dependent on non-operative LE and bil. UE push to complete sit to stand  Ambulation/Gait Ambulation/Gait assistance: Min guard (without  physical contact.) Ambulation Distance (Feet): 145 Feet Assistive device: Rolling walker (2 wheeled) Gait Pattern/deviations: Step-to pattern;Step-through pattern (Progressed.) Gait velocity: slow Gait velocity interpretation: Below normal speed for age/gender General Gait Details: Moving slowly, but steadily. Benefits from cues to maintain posture, though improved from yesterday. Able to dual-task safely.   Stairs            Wheelchair Mobility    Modified Rankin (Stroke Patients Only)       Balance             Standing balance-Leahy Scale: Fair                      Cognition Arousal/Alertness: Awake/alert Behavior During Therapy: WFL for tasks assessed/performed Overall Cognitive Status: Within Functional Limits for tasks assessed                      Exercises Total Joint Exercises Goniometric ROM: approx 8-50 degrees    General Comments General comments (skin integrity, edema, etc.): Able to activate quad for single limb stance stability; no noted buckling.      Pertinent Vitals/Pain Pain Assessment: Faces Pain Score:  (Did not rate pain, but c/o pain during night.) Faces Pain Scale: Hurts little more Pain Location: R knee Pain Descriptors / Indicators: Throbbing Pain Intervention(s): Premedicated before session;Monitored during session    Home Living                      Prior Function            PT Goals (current goals can  now be found in the care plan section) Acute Rehab PT Goals Patient Stated Goal: walking independently, go home, [place] dance PT Goal Formulation: With patient Time For Goal Achievement: 06/13/14 Potential to Achieve Goals: Good Progress towards PT goals: Progressing toward goals    Frequency  7X/week    PT Plan Current plan remains appropriate    Co-evaluation             End of Session Equipment Utilized During Treatment: Gait belt;Right knee immobilizer Activity Tolerance:  Patient tolerated treatment well;Patient limited by pain Patient left: in chair;with call bell/phone within reach     Time: 0815-0853 PT Time Calculation (min) (ACUTE ONLY): 38 min  Charges:                       G CodesClinton Sawyer:      Verda Mehta, SPT  Acute Rehabilitation  [phone]  Clinton SawyerWyant, Sarann Tregre 06/02/2014, 11:08 AM

## 2014-06-02 NOTE — Progress Notes (Signed)
Physical Therapy Treatment Patient Details Name: Sophia Lowery MRN: [id number] DOB: 01/07/1951 Today's Date: 06/02/2014    History of Present Illness Patient is a 63 y/o female s/p R TKA. WBAT RLE. PMH of HTN, heart murmur and HA.    PT Comments    PM session focused on assessing pt's ability to manage with more complicated tasks necessary for independent functioning in home alone, including negotiating steps; She required assist of 2 people to safely ascend 2 steps; At this point, home alone is not a safe, sustainable situation for pt;   Still, given her progress thus far and good rehab potential, pt will benefit from continued post-acute rehab at SNF to maximize independence and safety with mobility prior to dc home alone.   Follow Up Recommendations  SNF     Equipment Recommendations  Rolling walker with 5" wheels;3in1 (PT)    Recommendations for Other Services       Precautions / Restrictions Precautions Precautions: Knee Precaution Comments: Pt educated to not allow any pillow or bolster under knee for healing with optimal range of motion.  Required Braces or Orthoses: Knee Immobilizer - Right Knee Immobilizer - Right: On when out of bed or walking Restrictions RLE Weight Bearing: Weight bearing as tolerated    Mobility  Bed Mobility Overal bed mobility: Needs Assistance Bed Mobility: Supine to Sit     Supine to sit: Min guard     General bed mobility comments: Did not need physical assist, however very slow moving  Transfers   Equipment used: Rolling walker (2 wheeled) Transfers: Sit to/from Stand Sit to Stand: Min guard         General transfer comment: Dependent on non-operative LE and bil. UE push to complete sit to stand; More difficulty standing from low mat table  Ambulation/Gait Ambulation/Gait assistance: Min guard (without physical contact) Ambulation Distance (Feet): 55 Feet (x2, to and from Rehab Gym) Assistive device: Rolling walker (2  wheeled) Gait Pattern/deviations: Step-through pattern;Decreased dorsiflexion - right (noted heavier footfall L, indicative of pain in R stance) Gait velocity: 0.755 feet/second Gait velocity interpretation: <1.8 ft/sec, indicative of risk for recurrent falls General Gait Details: R knee stable in stance with uncomplicated amb; Cues for posture   Stairs Stairs: Yes Stairs assistance: +2 physical assistance Stair Management: One rail Right;Forwards;With walker (For home, pt will need to hold RW to have it upstairs) Number of Stairs: 2 General stair comments: Cues for sequence and technqiue; took time to get a picture of her home steps, rails etc, and to practice in a way that she would need to to function at home, i.e. she must have her RW with her negotiatin steps as she lives alone  Wheelchair Mobility    Modified Rankin (Stroke Patients Only)       Balance             Standing balance-Leahy Scale: Fair                      Cognition Arousal/Alertness: Awake/alert Behavior During Therapy: WFL for tasks assessed/performed Overall Cognitive Status: Within Functional Limits for tasks assessed                      Exercises      General Comments        Pertinent Vitals/Pain Pain Assessment: Faces Faces Pain Scale: Hurts worst Pain Location: R knee with stair training Pain Descriptors / Indicators: Aching Pain Intervention(s): Monitored during session;Premedicated  before session;Repositioned    Home Living                      Prior Function            PT Goals (current goals can now be found in the care plan section) Acute Rehab PT Goals Patient Stated Goal: walking independently, go home, [place] dance PT Goal Formulation: With patient Time For Goal Achievement: 06/13/14 Potential to Achieve Goals: Good Progress towards PT goals: Progressing toward goals (Needs continued work on therex)    Frequency  7X/week    PT Plan  Current plan remains appropriate    Co-evaluation             End of Session Equipment Utilized During Treatment: Gait belt Activity Tolerance: Patient tolerated treatment well;Patient limited by pain Patient left: in chair;with call bell/phone within reach     Time: 1623-1650 PT Time Calculation (min) (ACUTE ONLY): 27 min  Charges:  $Gait Training: 23-37 mins                    G Codes:      Olen PelGarrigan, Dejanee Thibeaux Hamff 06/02/2014, 5:27 PM  Van ClinesHolly Jonnae Fonseca, [place]  Acute Rehabilitation Services Pager [phone] Office [phone]

## 2014-06-02 NOTE — Clinical Social Work Note (Addendum)
Patient to discharge today to [facility] RN to call report to: [phone] Transportation: PTAR  Pt is agreeable to discharge plans.  CSW has received PASARR and sent DC summary to SNF for review.  SNF awaiting insurance authorization.    Gina Joy Haegele, LCSWA [phone]  Psychiatric & Orthopedics (5N 1-16) Clinical Social Worker  

## 2014-06-03 LAB — PROTIME-INR
INR: 1.82 — ABNORMAL HIGH (ref 0.00–1.49)
Prothrombin Time: 21.3 seconds — ABNORMAL HIGH (ref 11.6–15.2)

## 2014-06-03 MED ORDER — ENOXAPARIN SODIUM 40 MG/0.4ML ~~LOC~~ SOLN: 40.0000 mg | SUBCUTANEOUS | Status: AC

## 2014-06-03 NOTE — Progress Notes (Signed)
Physical Therapy Treatment Patient Details Name: Sophia CurryRena T Lowery MRN: [id number] DOB: Apr 22, 1951 Today's Date: 06/03/2014    History of Present Illness Patient is a 63 y/o female s/p R TKA. WBAT RLE. PMH of HTN, heart murmur and HA.    PT Comments    Session focused on knee range of motion and bed mobility in order to maximize independence and safety with mobility. Pt continues to improve knee flexion motion and demonstrate improved bed mobility. Will benefit from consistent daily therapy in skilled post-acute rehabilitation services. Anticipate good progress in order to d/c home alone safely.    Follow Up Recommendations  SNF     Equipment Recommendations  Rolling walker with 5" wheels;3in1 (PT)    Recommendations for Other Services       Precautions / Restrictions Precautions Precautions: Knee Restrictions Weight Bearing Restrictions: Yes RLE Weight Bearing: Weight bearing as tolerated    Mobility  Bed Mobility Overal bed mobility: Needs Assistance Bed Mobility: Supine to Sit     Supine to sit: Supervision Sit to supine: Supervision   General bed mobility comments: Did not need physical assist, however very slow moving; Struggled on the mat in the ortho gym more than in hospital bed due to no rails.  Transfers Overall transfer level: Needs assistance Equipment used: Rolling walker (2 wheeled) Transfers: Sit to/from Stand Sit to Stand: Supervision         General transfer comment: Dependent on non-operative LE and bil. UE push to complete sit to stand; More difficulty standing from low mat table  Ambulation/Gait Ambulation/Gait assistance: Min guard Ambulation Distance (Feet): 70 Feet Assistive device: Rolling walker (2 wheeled) Gait Pattern/deviations: Step-through pattern Gait velocity: slow Gait velocity interpretation: Below normal speed for age/gender General Gait Details: R knee stable in stance with uncomplicated amb; Cues for posture   Stairs             Wheelchair Mobility    Modified Rankin (Stroke Patients Only)       Balance             Standing balance-Leahy Scale: Fair                      Cognition Arousal/Alertness: Awake/alert Behavior During Therapy: WFL for tasks assessed/performed Overall Cognitive Status: Within Functional Limits for tasks assessed                      Exercises Total Joint Exercises Knee Flexion: AROM;AAROM;Right;Limitations (3 reps) Knee Flexion Limitations: Pain and swelling Goniometric ROM: approx 60 degrees of knee flexion    General Comments        Pertinent Vitals/Pain Pain Assessment: 0-10 Pain Score: 10-Worst pain ever Faces Pain Scale:  (Pt stated that pain was a "20" during range of motion. ) Pain Location: R knee Pain Descriptors / Indicators: Grimacing Pain Intervention(s): Monitored during session;Limited activity within patient's tolerance    Home Living                      Prior Function            PT Goals (current goals can now be found in the care plan section) Acute Rehab PT Goals PT Goal Formulation: With patient Time For Goal Achievement: 06/13/14 Potential to Achieve Goals: Good Progress towards PT goals: Progressing toward goals    Frequency  7X/week    PT Plan Current plan remains appropriate    Co-evaluation  End of Session Equipment Utilized During Treatment: Gait belt Activity Tolerance: Patient tolerated treatment well;Patient limited by pain Patient left: in chair;with call bell/phone within reach     Time: 1210-1245 PT Time Calculation (min) (ACUTE ONLY): 35 min  Charges:                       G CodesClinton Lowery:      Sophia Lowery, SPT Acute Rehabilitation Services [phone]  Sophia SawyerWyant, Sophia Lowery 06/03/2014, 4:08 PM

## 2014-06-03 NOTE — Progress Notes (Signed)
Orthopedics Progress Note  Subjective: I am eating better now.  NO BM yet. Therapy is going well.  Objective:  Filed Vitals:   06/03/14 0516  BP: 135/76  Pulse: 91  Temp: 97.8 F (36.6 C)  Resp: 18    General: Awake and alert  Musculoskeletal: Right knee dressing CDI, no cords, neg Homan's test. Neurovascularly intact  Lab Results  Component Value Date   WBC 10.8* 06/02/2014   HGB 9.1* 06/02/2014   HCT 28.6* 06/02/2014   MCV 87.7 06/02/2014   PLT 269 06/02/2014       Component Value Date/Time   NA 135* 05/31/2014 0558   K 3.5* 05/31/2014 0558   CL 96 05/31/2014 0558   CO2 28 05/31/2014 0558   GLUCOSE 135* 05/31/2014 0558   BUN 10 05/31/2014 0558   CREATININE 0.70 05/31/2014 0558   CALCIUM 8.2* 05/31/2014 0558   GFRNONAA >90 05/31/2014 0558   GFRAA >90 05/31/2014 0558    Lab Results  Component Value Date   INR 1.82* 06/03/2014   INR 1.89* 06/02/2014   INR 1.45 06/01/2014    Assessment/Plan: POD #4 s/p Procedure(s): RIGHT TOTAL KNEE ARTHROPLASTY Patient is doing very well.  Would like to see her have a BM prior to D/C  D/C planning.  INR still subtherapeutic.  Will need to have her on Lovenox until INR is greater than 2.5.  Almedia BallsSteven R. Ranell PatrickNorris, MD 06/03/2014 7:50 AM

## 2014-06-03 NOTE — Plan of Care (Signed)
Problem: Phase III Progression Outcomes Goal: Pain controlled on oral analgesia Outcome: Completed/Met Date Met:  06/03/14     

## 2014-06-03 NOTE — Progress Notes (Signed)
[facility] per patient request. Report called to [employer] [facility]. Patient will be transported via [facility] at 2pm.

## 2014-06-03 NOTE — Clinical Social Work Note (Addendum)
11:10am- CSW updated RN and SNF.  RN updated patient.  Patient request to remain until after a bath and lunch. PTAR scheduled for 2pm.  All parties aware and agreeable to these discharge plans.  RN to call report to: [facility] [phone]  10:48am- Insurance authorization has been received.  PTAR authorization has been received #[id number]. [facility] and requested time of bed availability for patient time of discharge today.  CSW is awaiting a return call.  Will update RN and patient once details received from [facility].  Vickii PennaGina Skilar Marcou, LCSWA [phone]  Psychiatric & Orthopedics (5N 1-16) Clinical Social Worker

## 2014-06-04 ENCOUNTER — Non-Acute Institutional Stay (SKILLED_NURSING_FACILITY): Payer: PRIVATE HEALTH INSURANCE | Admitting: Adult Health

## 2014-06-04 ENCOUNTER — Encounter: Payer: Self-pay | Admitting: Adult Health

## 2014-06-04 DIAGNOSIS — Z7901 Long term (current) use of anticoagulants: Secondary | ICD-10-CM

## 2014-06-04 DIAGNOSIS — J309 Allergic rhinitis, unspecified: Secondary | ICD-10-CM

## 2014-06-04 DIAGNOSIS — M179 Osteoarthritis of knee, unspecified: Secondary | ICD-10-CM

## 2014-06-04 DIAGNOSIS — M1711 Unilateral primary osteoarthritis, right knee: Secondary | ICD-10-CM

## 2014-06-04 DIAGNOSIS — E876 Hypokalemia: Secondary | ICD-10-CM

## 2014-06-04 DIAGNOSIS — D62 Acute posthemorrhagic anemia: Secondary | ICD-10-CM

## 2014-06-04 DIAGNOSIS — R600 Localized edema: Secondary | ICD-10-CM

## 2014-06-04 NOTE — Progress Notes (Signed)
Patient ID: Sophia Lowery, female   DOB: May 04, 1951, 63 y.o.   MRN: [id number]   06/04/2014  Facility:  Nursing Home Location:  [facility] Room Number: 103-P LEVEL OF CARE:  SNF (31)    Chief Complaint  Patient presents with  . Hospitalization Follow-up : Degenerative arthritis status post right total knee arthroplasty, bilateral lower extremity edema, allergic rhinitis, long-term use of anticoagulant, hypokalemia and anemia     HISTORY OF PRESENT ILLNESS:  This is a 63 year old female who has been [facility] with Degenerative arthritis of right knee S/P right total knee arthroplasty. She has been admitted for a short-term rehabilitation.  REASSESSMENT OF ONGOING PROBLEMS:  ALLERGIC RHINITIS: Allergic rhinitis remains stable.  Patient denies ongoing symptoms such as runny nose sneezing or tearing. No complications reported from the current medication(s) being used.  EDEMA: The patient's edema remains stable. Patient denies increasing lower extremity swelling, calf pain, chest pain or shortness of breath. No complications reported from the medications currently being used.  HYPOKALEMIA: The patient's hypokalemia remains stable. Patient denies muscle cramping or palpitations. No complications reported from current potassium supplementation. 11/15 K 3.5  PAST MEDICAL HISTORY:  Past Medical History  Diagnosis Date  . Fluid retention     take Lasix  . Heart murmur     since a child  . Hypertension   . H/O seasonal allergies   . Headache     sinus especially right side  . Arthritis     CURRENT MEDICATIONS: Reviewed per MAR/see medication list  Allergies  Allergen Reactions  . Other     Certain antibiotics cause yeast infections     REVIEW OF SYSTEMS:  GENERAL: no change in appetite, no fatigue, no weight changes, no fever, chills or weakness RESPIRATORY: no cough, SOB, DOE, wheezing,  hemoptysis CARDIAC: no chest pain, or palpitations, +edema GI: no abdominal pain, diarrhea, constipation, heart burn, nausea or vomiting  PHYSICAL EXAMINATION  GENERAL: no acute distress, morbidly obese SKIN:  Right knee surgical incision is dry, no redness EYES: conjunctivae normal, sclerae normal, normal eye lids NECK: supple, trachea midline, no neck masses, no thyroid tenderness, no thyromegaly LYMPHATICS: no LAN in the neck, no supraclavicular LAN RESPIRATORY: breathing is even & unlabored, BS CTAB CARDIAC: RRR, no murmur,no extra heart sounds, BLE edema 2+ GI: abdomen soft, normal BS, no masses, no tenderness, no hepatomegaly, no splenomegaly EXTREMITIES:  Able to move all 4 extremities PSYCHIATRIC: the patient is alert & oriented to person, affect & behavior appropriate  LABS/RADIOLOGY: Labs reviewed: Basic Metabolic Panel:  Recent Labs  40/98/1110/04/01 0944 05/30/14 1424 05/31/14 0558  NA 141  --  135*  K 4.1  --  3.5*  CL 101  --  96  CO2 27  --  28  GLUCOSE 89  --  135*  BUN 18  --  10  CREATININE 0.77 0.66 0.70  CALCIUM 9.2  --  8.2*   CBC:  Recent Labs  05/31/14 0558 06/01/14 0605 06/02/14 0455  WBC 12.9* 11.6* 10.8*  HGB 10.7* 9.7* 9.1*  HCT 34.1* 30.5* 28.6*  MCV 87.7 87.6 87.7  PLT 299 260 269    Dg Knee Right Port  05/30/2014   CLINICAL DATA:  Post total knee replacement  EXAM: PORTABLE RIGHT KNEE - 1-2 VIEW  COMPARISON:  None.  FINDINGS: Skin staples and subcutaneous gas noted. Tripartite total knee arthroplasty in place without evidence for hardware  failure. No fracture line visualized.  IMPRESSION: Expected postoperative appearance after right total knee arthroplasty.   Electronically Signed   By: Christiana PellantGretchen  Green M.D.   On: 05/30/2014 10:57   ASSESSMENT/PLAN:  Degenerative arthritis status post right total knee arthroplasty - for rehabilitation Bilateral lower extremity edema - continue Lasix 20 mg 1 tab by mouth daily Allergic rhinitis - stable;  continue Zyrtec 10 mg by mouth daily Long-term use of anticoagulant - INR 2.1 - therapeutic; continue Coumadin 5 mg PO Q D and repeat INR on 06/06/14 Hypokalemia - continue supplementation Anemia, acute blood loss - stable; hgb 9.1   Spent 50 minutes in patient care.    [facility],Amaziah Raisanen, NP [facility] [phone]

## 2014-06-05 ENCOUNTER — Non-Acute Institutional Stay (SKILLED_NURSING_FACILITY): Payer: PRIVATE HEALTH INSURANCE | Admitting: Internal Medicine

## 2014-06-05 DIAGNOSIS — R600 Localized edema: Secondary | ICD-10-CM

## 2014-06-05 DIAGNOSIS — D62 Acute posthemorrhagic anemia: Secondary | ICD-10-CM

## 2014-06-05 DIAGNOSIS — E876 Hypokalemia: Secondary | ICD-10-CM

## 2014-06-05 DIAGNOSIS — M179 Osteoarthritis of knee, unspecified: Secondary | ICD-10-CM

## 2014-06-05 DIAGNOSIS — J309 Allergic rhinitis, unspecified: Secondary | ICD-10-CM

## 2014-06-05 DIAGNOSIS — M1711 Unilateral primary osteoarthritis, right knee: Secondary | ICD-10-CM
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
## 2014-06-16 ENCOUNTER — Other Ambulatory Visit: Payer: Self-pay | Admitting: *Deleted

## 2014-06-16 MED ORDER — OXYCODONE-ACETAMINOPHEN 5-325 MG PO TABS: ORAL_TABLET | ORAL | Status: AC

## 2014-06-16 MED ORDER — OXYCODONE-ACETAMINOPHEN 5-325 MG PO TABS: ORAL_TABLET | ORAL | Status: DC

## 2014-06-16 NOTE — Telephone Encounter (Signed)
[facility] 

## 2014-06-20 ENCOUNTER — Non-Acute Institutional Stay (SKILLED_NURSING_FACILITY): Payer: PRIVATE HEALTH INSURANCE | Admitting: Adult Health

## 2014-06-20 DIAGNOSIS — R600 Localized edema: Secondary | ICD-10-CM

## 2014-06-20 DIAGNOSIS — E876 Hypokalemia: Secondary | ICD-10-CM

## 2014-06-20 DIAGNOSIS — Z7901 Long term (current) use of anticoagulants: Secondary | ICD-10-CM

## 2014-06-20 DIAGNOSIS — J309 Allergic rhinitis, unspecified: Secondary | ICD-10-CM

## 2014-06-20 DIAGNOSIS — M1711 Unilateral primary osteoarthritis, right knee: Secondary | ICD-10-CM

## 2014-06-20 DIAGNOSIS — D62 Acute posthemorrhagic anemia: Secondary | ICD-10-CM

## 2014-06-24 ENCOUNTER — Encounter: Payer: Self-pay | Admitting: Adult Health

## 2014-06-24 NOTE — Progress Notes (Signed)
Patient ID: Sophia Lowery, female   DOB: 14-Dec-1950, 63 y.o.   MRN: [id number]   06/20/14  Facility:  Nursing Home Location:  [facility] Room Number: 103-P LEVEL OF CARE:  SNF (31)    Chief Complaint  Patient presents with  . Discharge Note : Degenerative arthritis S/P right total knee arthroplasty, bilateral lower extremity edema, allergic rhinitis, long-term use of anticoagulant, hypokalemia and anemia     HISTORY OF PRESENT ILLNESS:  This is a 63 year old female who is for discharge home with home health PT and Nursing. DME: Standard rolling walker. [facility] with Degenerative arthritis of right knee S/P right total knee arthroplasty. Patient was admitted to this facility for short-term rehabilitation after the patient's recent hospitalization.  Patient has completed SNF rehabilitation and therapy has cleared the patient for discharge.  REASSESSMENT OF ONGOING PROBLEMS:  ANEMIA: The anemia has been stable. The patient denies fatigue, melena or hematochezia.  11/15 hgb 9.2  EDEMA: The patient's edema remains stable. Patient denies increasing lower extremity swelling, calf pain, chest pain or shortness of breath. No complications reported from the medications currently being used.  HYPOKALEMIA: The patient's hypokalemia remains stable. Patient denies muscle cramping or palpitations. No complications reported from current potassium supplementation. 11/15 K 3.7  PAST MEDICAL HISTORY:  Past Medical History  Diagnosis Date  . Fluid retention     take Lasix  . Heart murmur     since a child  . Hypertension   . H/O seasonal allergies   . Headache     sinus especially right side  . Arthritis     CURRENT MEDICATIONS: Reviewed per MAR/see medication list  Allergies  Allergen Reactions  . Other     Certain antibiotics cause yeast infections     REVIEW OF SYSTEMS:  GENERAL: no  change in appetite, no fatigue, no weight changes, no fever, chills or weakness RESPIRATORY: no cough, SOB, DOE, wheezing, hemoptysis CARDIAC: no chest pain, or palpitations, +edema GI: no abdominal pain, diarrhea, constipation, heart burn, nausea or vomiting  PHYSICAL EXAMINATION  GENERAL: no acute distress, morbidly obese SKIN:  Right knee surgical incision is dry, no redness NECK: supple, trachea midline, no neck masses, no thyroid tenderness, no thyromegaly LYMPHATICS: no LAN in the neck, no supraclavicular LAN RESPIRATORY: breathing is even & unlabored, BS CTAB CARDIAC: RRR, no murmur,no extra heart sounds, BLE edema 2+ GI: abdomen soft, normal BS, no masses, no tenderness, no hepatomegaly, no splenomegaly EXTREMITIES:  Able to move all 4 extremities; walks with walker PSYCHIATRIC: the patient is alert & oriented to person, affect & behavior appropriate  LABS/RADIOLOGY:  06/06/14  WBC 10.3 hemoglobin 9.2 hematocrit 30.5 MCV 89.7 sodium 135 potassium 3.7 glucose 89 BUN 15 creatinine 0.5 calcium 30.0 Labs reviewed: Basic Metabolic Panel:  Recent Labs  40/98/1110/04/01 0944 05/30/14 1424 05/31/14 0558  NA 141  --  135*  K 4.1  --  3.5*  CL 101  --  96  CO2 27  --  28  GLUCOSE 89  --  135*  BUN 18  --  10  CREATININE 0.77 0.66 0.70  CALCIUM 9.2  --  8.2*   CBC:  Recent Labs  05/31/14 0558 06/01/14 0605 06/02/14 0455  WBC 12.9* 11.6* 10.8*  HGB 10.7* 9.7* 9.1*  HCT 34.1* 30.5* 28.6*  MCV 87.7 87.6 87.7  PLT 299 260 269    Dg Knee Right  Port  05/30/2014   CLINICAL DATA:  Post total knee replacement  EXAM: PORTABLE RIGHT KNEE - 1-2 VIEW  COMPARISON:  None.  FINDINGS: Skin staples and subcutaneous gas noted. Tripartite total knee arthroplasty in place without evidence for hardware failure. No fracture line visualized.  IMPRESSION: Expected postoperative appearance after right total knee arthroplasty.   Electronically Signed   By: Christiana PellantGretchen  Green M.D.   On: 05/30/2014 10:57    ASSESSMENT/PLAN:  Degenerative arthritis status post right total knee arthroplasty - for home health PT and OT Bilateral lower extremity edema - continue Lasix 20 mg 1 tab by mouth daily Allergic rhinitis - stable; continue Zyrtec 10 mg by mouth daily Long-term use of anticoagulant - INR 2.1 - therapeutic; continue Coumadin 6 mg PO Q D and repeat INR on 06/24/14 Hypokalemia - continue supplementation with K-Dur 20 meq by mouth daily Anemia, acute blood loss - stable; hgb 9.2   I have filled out patient's discharge paperwork and written prescriptions.  Patient will receive home health PT and Nursing.   DME provided: Standard rolling walker  Total discharge time: Greater than 30 minutes  Discharge time involved coordination of the discharge process with social worker, nursing staff and therapy department. Medical justification for home health services/DME verified.    [facility],Tayjon Halladay, NP [facility] [phone]

## 2015-06-27 IMAGING — CR DG KNEE 1-2V PORT*R*
2 series · 2 of 2 positions shown · non-contrast
Comparison: None.

CLINICAL DATA: Post total knee replacement

EXAM:
PORTABLE RIGHT KNEE - 1-2 VIEW

[AP]
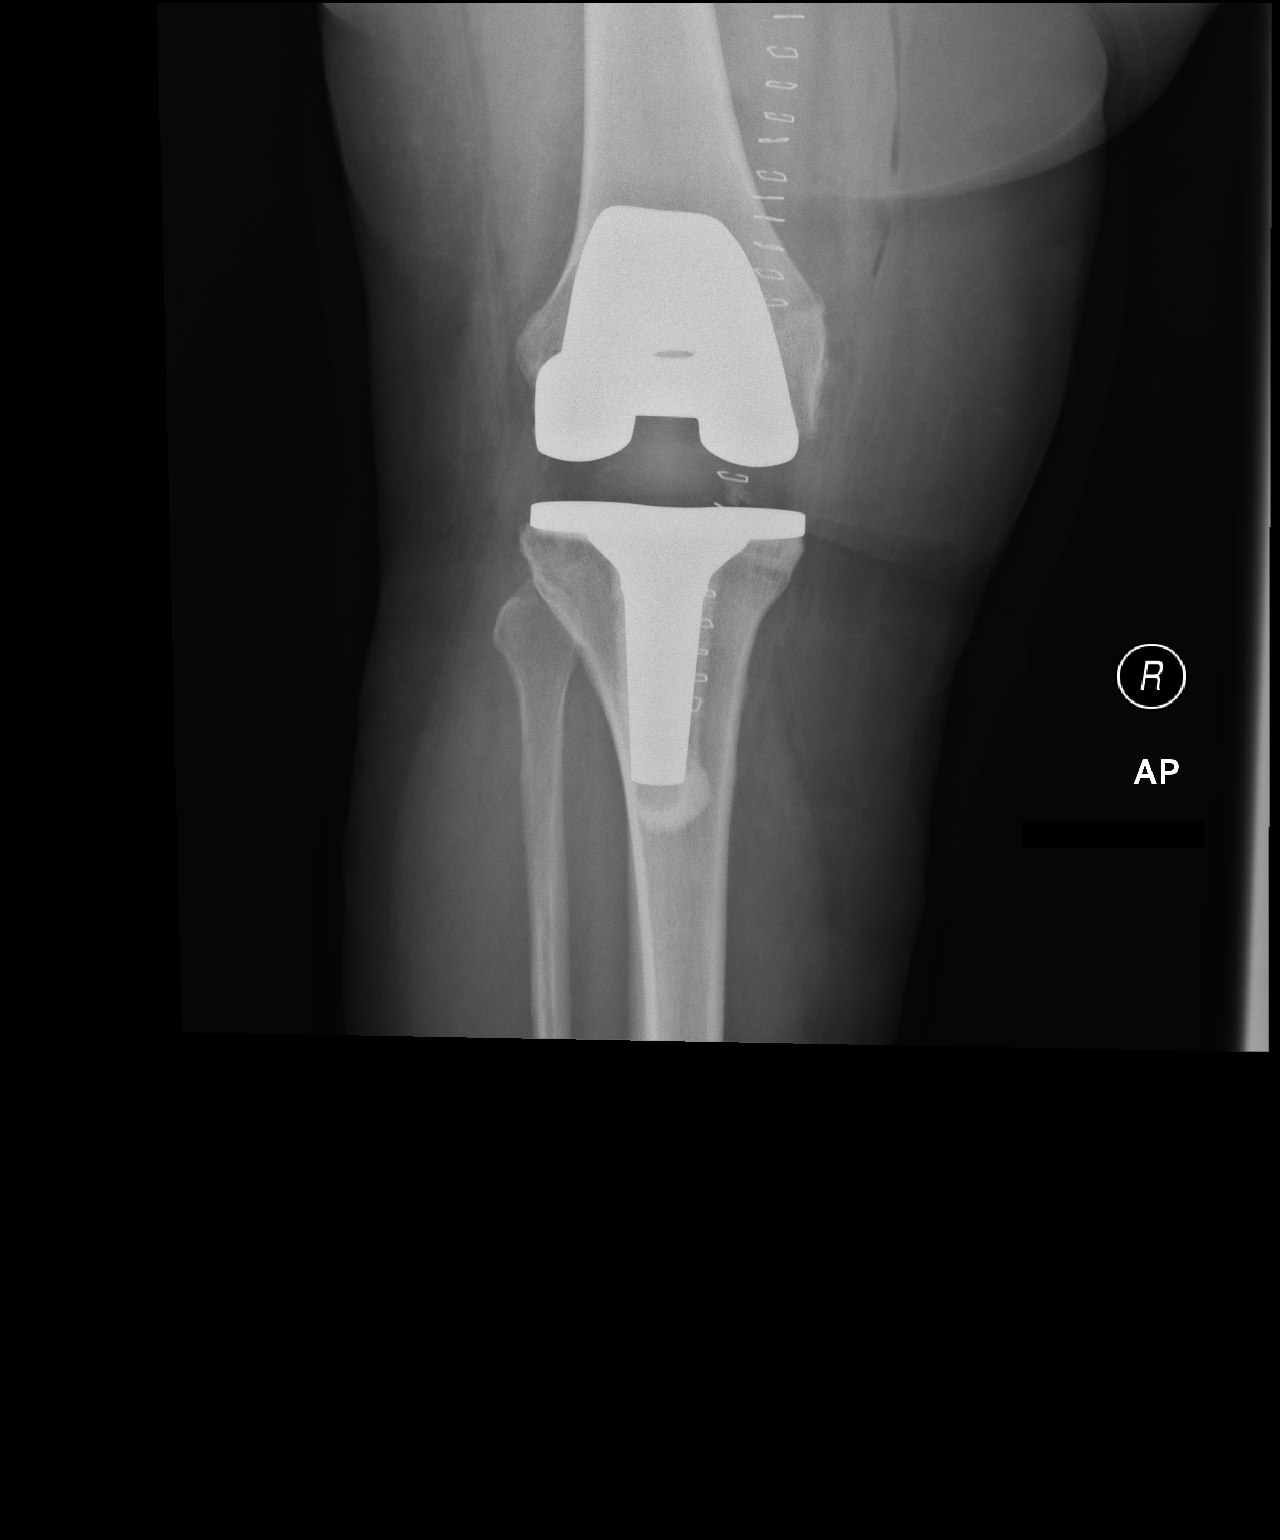

[xtable lateral]
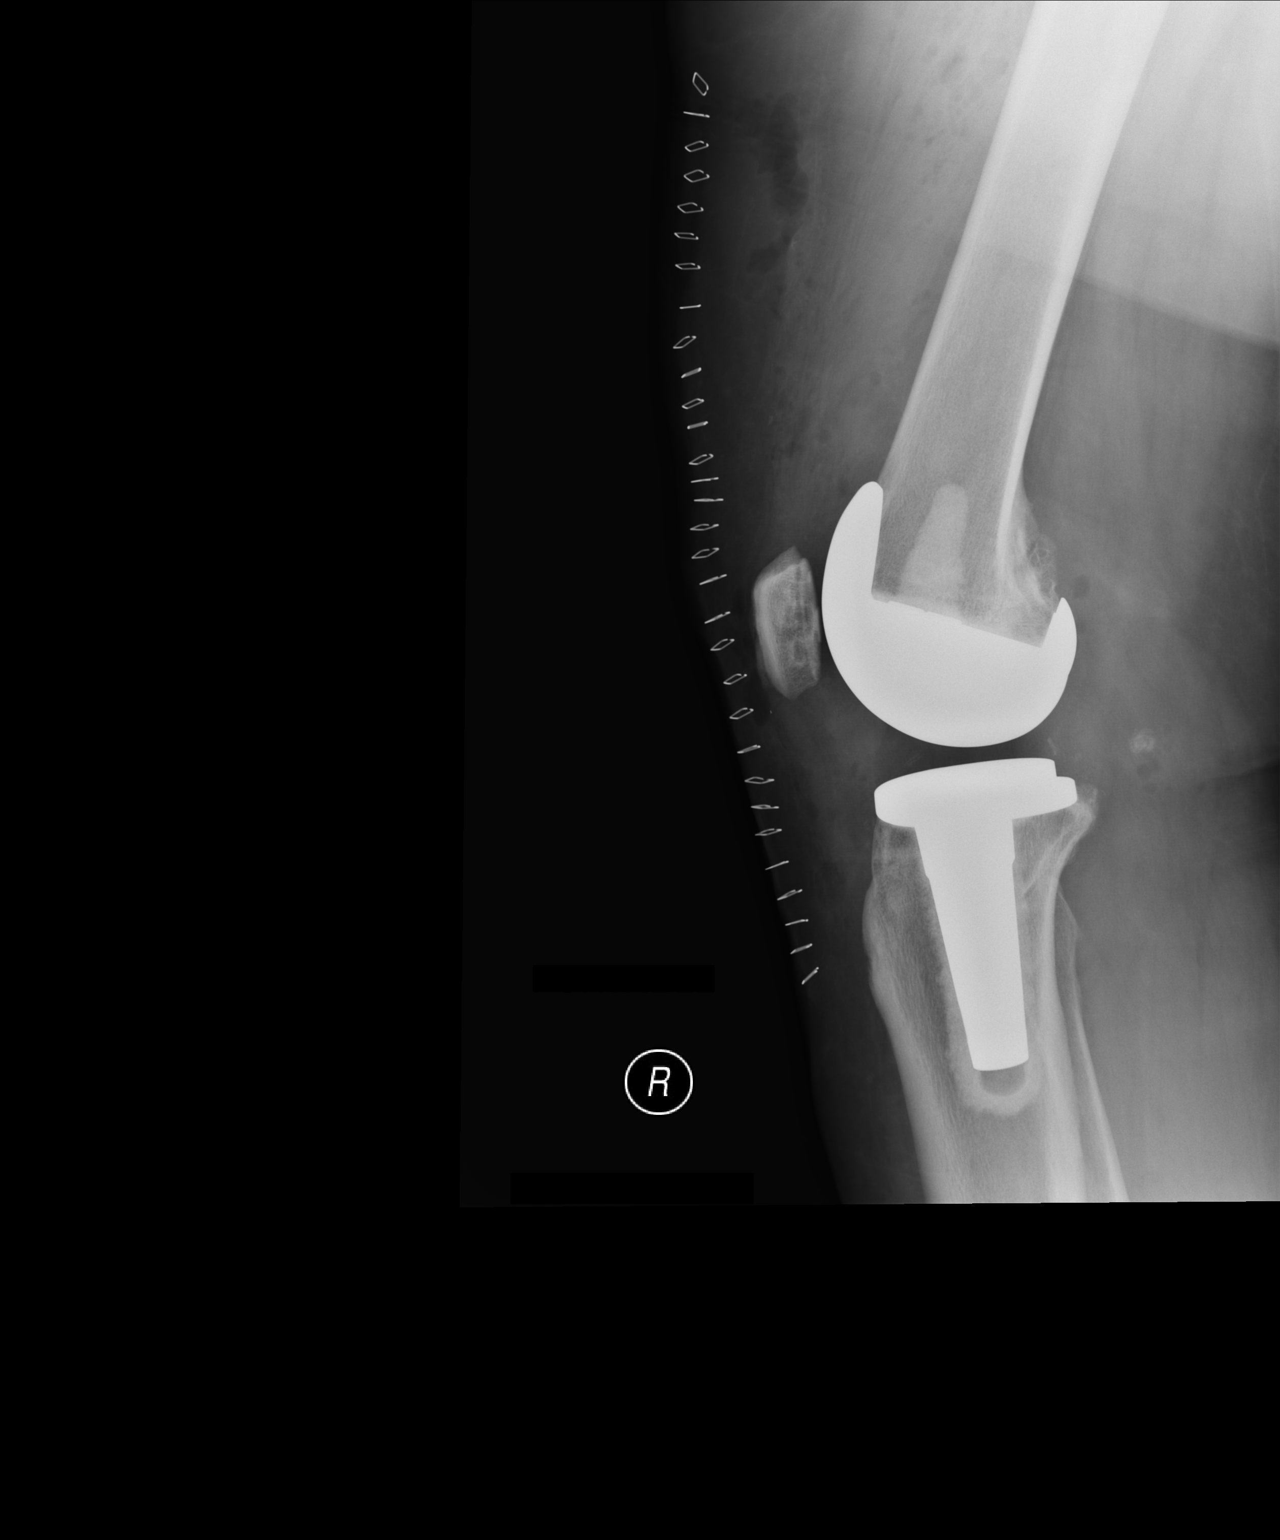

[2 of 2 positions shown; findings below may reference images not displayed]

FINDINGS: Skin staples and subcutaneous gas noted. Tripartite total knee
arthroplasty in place without evidence for hardware failure. No
fracture line visualized.
IMPRESSION: Expected postoperative appearance after right total knee
arthroplasty.

## 2018-07-12 DIAGNOSIS — C9000 Multiple myeloma not having achieved remission: Secondary | ICD-10-CM | POA: Insufficient documentation

## 2019-05-15 DIAGNOSIS — Z9481 Bone marrow transplant status: Secondary | ICD-10-CM | POA: Insufficient documentation

## 2022-01-10 DIAGNOSIS — J0110 Acute frontal sinusitis, unspecified: Secondary | ICD-10-CM | POA: Insufficient documentation

## 2022-01-10 DIAGNOSIS — J209 Acute bronchitis, unspecified: Secondary | ICD-10-CM | POA: Insufficient documentation

## 2023-02-02 DIAGNOSIS — L240 Irritant contact dermatitis due to detergents: Secondary | ICD-10-CM | POA: Insufficient documentation

## 2023-02-02 DIAGNOSIS — B3731 Acute candidiasis of vulva and vagina: Secondary | ICD-10-CM | POA: Insufficient documentation

## 2023-02-02 DIAGNOSIS — R42 Dizziness and giddiness: Secondary | ICD-10-CM | POA: Insufficient documentation

## 2023-02-02 DIAGNOSIS — G629 Polyneuropathy, unspecified: Secondary | ICD-10-CM | POA: Insufficient documentation

## 2023-02-02 DIAGNOSIS — D701 Agranulocytosis secondary to cancer chemotherapy: Secondary | ICD-10-CM | POA: Insufficient documentation

## 2023-02-02 DIAGNOSIS — Z8679 Personal history of other diseases of the circulatory system: Secondary | ICD-10-CM | POA: Insufficient documentation

## 2023-02-02 DIAGNOSIS — D6481 Anemia due to antineoplastic chemotherapy: Secondary | ICD-10-CM | POA: Insufficient documentation

## 2023-02-02 DIAGNOSIS — R609 Edema, unspecified: Secondary | ICD-10-CM | POA: Insufficient documentation

## 2023-08-01 DIAGNOSIS — R9439 Abnormal result of other cardiovascular function study: Secondary | ICD-10-CM | POA: Insufficient documentation

## 2023-08-01 DIAGNOSIS — R0609 Other forms of dyspnea: Secondary | ICD-10-CM | POA: Insufficient documentation

## 2023-08-16 DIAGNOSIS — M1611 Unilateral primary osteoarthritis, right hip: Secondary | ICD-10-CM | POA: Insufficient documentation

## 2023-09-08 DIAGNOSIS — Z96641 Presence of right artificial hip joint: Secondary | ICD-10-CM | POA: Insufficient documentation

## 2023-09-25 DIAGNOSIS — Z471 Aftercare following joint replacement surgery: Secondary | ICD-10-CM | POA: Insufficient documentation

## 2023-11-24 ENCOUNTER — Ambulatory Visit (INDEPENDENT_AMBULATORY_CARE_PROVIDER_SITE_OTHER)

## 2023-11-24 ENCOUNTER — Ambulatory Visit: Admission: EM | Admit: 2023-11-24 | Discharge: 2023-11-24 | Disposition: A | Discharge status: Home / Self Care

## 2023-11-24 DIAGNOSIS — R0602 Shortness of breath: Secondary | ICD-10-CM | POA: Diagnosis not present

## 2023-11-24 DIAGNOSIS — D6481 Anemia due to antineoplastic chemotherapy: Secondary | ICD-10-CM | POA: Insufficient documentation

## 2023-11-24 DIAGNOSIS — J189 Pneumonia, unspecified organism: Secondary | ICD-10-CM

## 2023-11-24 DIAGNOSIS — Z0000 Encounter for general adult medical examination without abnormal findings: Secondary | ICD-10-CM | POA: Insufficient documentation

## 2023-11-24 DIAGNOSIS — R600 Localized edema: Secondary | ICD-10-CM | POA: Insufficient documentation

## 2023-11-24 DIAGNOSIS — B379 Candidiasis, unspecified: Secondary | ICD-10-CM | POA: Insufficient documentation

## 2023-11-24 DIAGNOSIS — L989 Disorder of the skin and subcutaneous tissue, unspecified: Secondary | ICD-10-CM | POA: Insufficient documentation

## 2023-11-24 DIAGNOSIS — Z87891 Personal history of nicotine dependence: Secondary | ICD-10-CM

## 2023-11-24 DIAGNOSIS — T451X5S Adverse effect of antineoplastic and immunosuppressive drugs, sequela: Secondary | ICD-10-CM | POA: Insufficient documentation

## 2023-11-24 DIAGNOSIS — R609 Edema, unspecified: Secondary | ICD-10-CM | POA: Insufficient documentation

## 2023-11-24 DIAGNOSIS — R42 Dizziness and giddiness: Secondary | ICD-10-CM | POA: Insufficient documentation

## 2023-11-24 DIAGNOSIS — Z9181 History of falling: Secondary | ICD-10-CM | POA: Insufficient documentation

## 2023-11-24 MED ORDER — DEXAMETHASONE SODIUM PHOSPHATE 10 MG/ML IJ SOLN
10.0000 mg | Freq: Once | INTRAMUSCULAR | Status: AC
  Administered 2023-11-24: 10 mg via INTRAMUSCULAR

## 2023-11-24 MED ORDER — IPRATROPIUM-ALBUTEROL 0.5-2.5 (3) MG/3ML IN SOLN
3.0000 mL | Freq: Once | RESPIRATORY_TRACT | Status: AC
  Administered 2023-11-24: 3 mL via RESPIRATORY_TRACT

## 2023-11-24 MED ORDER — AZITHROMYCIN 250 MG PO TABS: 250.0000 mg | ORAL_TABLET | Freq: Every day | ORAL | 0 refills | Status: AC

## 2023-11-24 MED ORDER — AMOXICILLIN-POT CLAVULANATE 875-125 MG PO TABS: 1.0000 | ORAL_TABLET | Freq: Two times a day (BID) | ORAL | 0 refills | Status: AC

## 2023-11-24 MED ORDER — AEROCHAMBER PLUS FLO-VU MEDIUM MISC
1.0000 | Freq: Once | Status: AC
  Administered 2023-11-24: 1

## 2023-11-24 MED ORDER — ALBUTEROL SULFATE HFA 108 (90 BASE) MCG/ACT IN AERS
2.0000 | INHALATION_SPRAY | Freq: Once | RESPIRATORY_TRACT | Status: AC
  Administered 2023-11-24: 2 via RESPIRATORY_TRACT
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
## 2023-11-24 NOTE — ED Triage Notes (Signed)
"  On the 26th of April I started with a cold/upper respiratory infection, I took the OTC treatment they suggested and now I can't get rid of it with cough increased to wheezing at time".

## 2023-11-24 NOTE — Discharge Instructions (Signed)
 Your exam today shows concern for pneumonia and bronchitis.  Pneumonia is a localized infection in the lungs that requires antibiotics.  Bronchitis is inflammation of the large airway tube in the lungs that is treated with steroids and albuterol.  Take antibiotics as prescribed.  We gave you a shot of steroid in the clinic that will help with inflammation of the lungs.  Use albuterol every 4-6 hours as needed for cough, shortness of breath, and wheezing.   Tylenol  as needed for aches and pains.  If you develop any new or worsening symptoms or if your symptoms do not start to improve, please return here or follow-up with your primary care provider. If your symptoms are severe, please go to the emergency room.

## 2023-12-01 ENCOUNTER — Telehealth: Payer: Self-pay | Admitting: Family Medicine

## 2023-12-01 MED ORDER — KETOCONAZOLE 2 % EX CREA: 1.0000 | TOPICAL_CREAM | Freq: Two times a day (BID) | CUTANEOUS | 0 refills | Status: AC | PRN

## 2023-12-01 MED ORDER — FLUCONAZOLE 150 MG PO TABS: 150.0000 mg | ORAL_TABLET | ORAL | 0 refills | Status: AC | PRN

## 2023-12-01 NOTE — Telephone Encounter (Signed)
 Called and advised nursing staff that she is experiencing a severe yeast infection after being treated with 2 antibiotics for community-acquired pneumonia.  She has 1 remaining dose of the Augmentin today.  Patient endorses that the yeast infection is in the vaginal area and extending past the perineum into the buttocks.  I am treating with an extended course of Diflucan 150 mg 1 dose today and every 3 days  For topical relief ketoconazole cream twice daily as needed for up to 7 to 10 days  Advised to return for evaluation if symptoms do not completely improve with treatment
